# Patient Record
Sex: Female | Born: 2000 | Race: White | Hispanic: No | Marital: Single | Smoking: Never smoker
Health system: Southern US, Community
[De-identification: ages and names within clinical notes are randomized; demographics above are authoritative.]

## PROBLEM LIST (undated history)

## (undated) DIAGNOSIS — R519 Headache, unspecified: Secondary | ICD-10-CM

## (undated) DIAGNOSIS — L309 Dermatitis, unspecified: Secondary | ICD-10-CM

## (undated) DIAGNOSIS — K900 Celiac disease: Secondary | ICD-10-CM

## (undated) HISTORY — DX: Headache, unspecified: R51.9

---
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
## 2022-01-10 ENCOUNTER — Encounter (INDEPENDENT_AMBULATORY_CARE_PROVIDER_SITE_OTHER): Payer: Self-pay | Admitting: Family

## 2022-01-10 ENCOUNTER — Ambulatory Visit (INDEPENDENT_AMBULATORY_CARE_PROVIDER_SITE_OTHER): Payer: BC Managed Care – PPO | Admitting: Family

## 2022-01-10 ENCOUNTER — Other Ambulatory Visit: Payer: Self-pay

## 2022-01-10 VITALS — HR 84 | Wt 120.0 lb

## 2022-01-10 DIAGNOSIS — F32A Depression, unspecified: Secondary | ICD-10-CM

## 2022-01-10 DIAGNOSIS — G43009 Migraine without aura, not intractable, without status migrainosus: Secondary | ICD-10-CM

## 2022-01-10 DIAGNOSIS — F419 Anxiety disorder, unspecified: Secondary | ICD-10-CM | POA: Diagnosis not present

## 2022-01-10 NOTE — Patient Instructions (Signed)
It was a pleasure to see you today! ? ?Instructions for you until your next appointment are as follows: ?Stop taking Trokendi XR.  ?Decrease the Gabapentin dose to 2 capsules in the morning and 2 capsules at night for 1 month. If that goes well, we can continue to reduce the dose as follows:   ?Beginning in month #2 - Take 1 capsule morning and 2 capsules at night for 2 weeks ?Then - take 1 capsule in the morning and 1 capsule at night for 2 weeks ?Then - take none in the morning and 1 capsule at night for 2 weeks ?Then stop the medication ?Continue taking the Emgality each month ?If your headaches become more frequent or more severe, let me know ?Please sign up for MyChart if you have not done so. ?Please plan to return for follow up in 6 months or sooner if needed. ? ?  ?Feel free to contact our office during normal business hours at [phone] with questions or concerns. If there is no answer or the call is outside business hours, please leave a message and our clinic staff will call you back within the next business day.  If you have an urgent concern, please stay on the line for our after-hours answering service and ask for the on-call neurologist.   ?  ?I also encourage you to use MyChart to communicate with me more directly. If you have not yet signed up for MyChart within [facility], the front desk staff can help you. However, please note that this inbox is NOT monitored on nights or weekends, and response can take up to 2 business days.  Urgent matters should be discussed with the on-call pediatric neurologist.  ? ?At [facility], we are committed to providing exceptional care. You will receive a patient satisfaction survey through text or email regarding your visit today. Your opinion is important to me. Comments are appreciated.   ?

## 2022-01-10 NOTE — Progress Notes (Signed)
? ?Latoya Macdonald   ?MRN:  [id number]  ?March 31, 2001  ? ?Provider: Elveria Rising NP-C ?Location of Care: [facility] ? ?Visit type: Return visit ? ?Last visit: 10/12/2021 ? ?Referral source: Reola Calkins, NP ?History from: Epic chart and patient ? ?Brief history:  ?Copied from previous record: ?History of daily persistent headaches, migraine and tension headaches. She is taking and tolerating Trokendi XR, Gabapentin and Emgality for migraine prevention.  ? ?Today's concerns: ?Latoya Macdonald reports today that she has had 2 migraines since her last visit in December. She has tapered the Trokendi XR to 1 capsule per day and has not had increase in migraines. Latoya Macdonald feels that the Emgality injections have been beneficial in reducing her headache frequency and severity.  ? ?Latoya Macdonald reports that the 2 migraines she had were both triggered by stressful events. She is working to learn how to manage stress when it occurs. 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  ? ?Latoya Macdonald has been otherwise generally healthy since she was last seen. She has no other health concerns today other than previously mentioned. ? ?Review of systems: ?Please see HPI for neurologic and other pertinent review of systems. Otherwise all other systems were reviewed and were negative. ? ?Problem List: ?Patient Active Problem List  ? Diagnosis Date Noted  ? Nausea without vomiting 07/08/2021  ? Injection education, encounter for 04/14/2021  ? Paresthesias 11/28/2020  ? Right arm weakness 11/28/2020  ? Migraine without aura, not intractable, without status migrainosus 11/11/2020  ? New daily persistent headache 10/22/2019  ? History of concussion 10/22/2019  ? Anxiety and depression 10/22/2019  ? 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  ? ?Past Medical History:  ?Diagnosis Date  ? Celiac disease   ? Eczema   ? Headache   ?  ?Past medical history comments: See HPI ?Copied from previous record: ?She has been seen in  headache clinic since 2017 and 2018 x 2 neurologists in [place]. ?  ?Medications have included cyproheptadine, nortriptyline, amitriptyline, Depakote, Cymbalta, gabapentin, propranolol, atenolol, Migrelief, Topamax, and Zonegran. ?  ?Abortive medications have included rizatriptan, sumatriptan, eletriptan, baclofen, multiple over-the-counter nonsteroidals, tizanidine chlorzoxazone, diclofenac. ?  ?Nonpharmacologic treatments have included physical therapy, chiropractic manipulation, ultrasound, TENS unit, acupuncture, dry needling. ?  ?Diagnoses have included chronic migraine, chronic tension type headache, occipital neuralgia, TMJ dysfunction. ?  ?MRI without contrast in October, 2013 which was normal. ?  ?On January 10, 2017 an occipital nerve block was performed with bupivacaine and lidocaine.  The patient had immediate numbness.  This did not provide long-term relief.  ?  ?She suffered a concussion while swimming July 16, 2018 which exacerbated her headaches. She had a CT scan at [facility] hospital which was normal.  ?  ?Birth History ?Infant born at [redacted] weeks gestational age to a 19 year old g 1 p 0 female. ?Gestation was complicated by uterine fibroids, stress and grief over the sudden unexpected death of her husband ?Mother received Epidural anesthesia ?Primary cesarean section for failure to progress after 20 hours of labor and 30 minutes of pushing ?Nursery Course was uncomplicated ?Growth and Development was recalled as  normal ?  ?Behavior History ?Anxiety and depression ?  ?Surgical history: ?History reviewed. No pertinent surgical history.  ? ?Family history: ?family history includes Migraines in her maternal grandmother.  ? ?Social history: ?Social History  ? ?Socioeconomic History  ? Marital status: Single  ?  Spouse name: Not on file  ?  Number of children: Not on file  ? Years of education: Not on file  ? Highest education level: Not on file  ?Occupational History  ? Not on file  ?Tobacco Use   ? Smoking status: Never  ? Smokeless tobacco: Never  ?Substance and Sexual Activity  ? Alcohol use: Not on file  ? Drug use: Not on file  ? Sexual activity: Not on file  ?Other Topics Concern  ? Not on file  ?Social History Narrative  ? She is at [employer] for her freshman year.  ? 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   ? ?Social Determinants of Health  ? ?Financial Resource Strain: Not on file  ?Food Insecurity: Not on file  ?Transportation Needs: Not on file  ?Physical Activity: Not on file  ?Stress: Not on file  ?Social Connections: Not on file  ?Intimate Partner Violence: Not on file  ?  ?Past/failed meds: ?Copied from previous record: ?Medications have included cyproheptadine, nortriptyline, amitriptyline, Depakote, Cymbalta, gabapentin, propranolol, atenolol, Migrelief, Topamax, and Zonegran. ?  ?Abortive medications have included rizatriptan, sumatriptan, eletriptan, baclofen, multiple over-the-counter nonsteroidals, tizanidine chlorzoxazone, diclofenac. ?  ?Nonpharmacologic treatments have included physical therapy, chiropractic manipulation, ultrasound, TENS unit, acupuncture, dry needling. ? ?Allergies: ?Allergies  ?Allergen Reactions  ? Gluten Meal   ?  Intolerance not celiac  ?  ?Immunizations: ? ?There is no immunization history on file for this patient.  ? ? ?Diagnostics/Screenings: ?Copied from previous record: ?11/25/2020 - MRI Cervical Spine W/WO contrast - Mildly motion degraded, but otherwise normal MRI of the cervical spine.  ?11/25/2020 - MRI Brain W/WO contrast - Normal brain MRI ? ?Physical Exam: ?Pulse 84   Wt 120 lb (54.4 kg)   BMI 21.32 kg/m?   ?General: Well developed, well nourished young woman, seated on exam table, in no evident distress ?Head: Head normocephalic and atraumatic.  Oropharynx benign. ?Neck: Supple ?Cardiovascular: Regular rate and rhythm, no murmurs ?Respiratory: Breath sounds clear to  auscultation ?Musculoskeletal: No obvious deformities or scoliosis ?Skin: No rashes or neurocutaneous lesions ? ?Neurologic Exam ?Mental Status: Awake and fully alert.  Oriented to place and time.  Recent and remote memory intact.  Attention span, concentration, and fund of knowledge appropriate.  Mood and affect appropriate. ?Cranial Nerves: Fundoscopic exam reveals sharp disc margins.  Pupils equal, briskly reactive to light.  Extraocular movements full without nystagmus. Hearing intact and symmetric to whisper.  Facial sensation intact.  Face tongue, palate move normally and symmetrically. Shoulder shrug normal ?Motor: Normal bulk and tone. Normal strength in all tested extremity muscles. ?Sensory: Intact to touch and temperature in all extremities.  ?Coordination: Rapid alternating movements normal in all extremities.  Finger-to-nose and heel-to shin performed accurately bilaterally.  Romberg negative. ?Gait and Station: Arises from chair without difficulty.  Stance is normal. Gait demonstrates normal stride length and balance.   Able to heel, toe and tandem walk without difficulty. ?Reflexes: 1+ and symmetric. Toes downgoing.  ? ?Impression: ?Migraine without aura, not intractable, without status migrainosus ? ?Anxiety and depression  ? ?Recommendations for plan of care:  ?The patient's previous [facility] records were reviewed. Marge has neither had nor required imaging or lab studies since the last visit. She is an 21 year old young woman with history of migraine and tension headaches, as well as anxiety and depression. She has experienced improvement in her headaches since being on Emgality and has tapered the dose of Trokendi XR without increase in headaches.  Because she is doing well, I recommended that she stop the Trokendi XR and then work on tapering the Gabapentin dose. I gave her written instructions on how to taper the medication, and asked her to let me know if the headaches return during this process. I  will otherwise see Maralyn SagoSarah back in follow up in 6 months or sooner if needed. She agreed with the plans made today ? ?The medication list was reviewed and reconciled. I reviewed the changes that were made in the prescribed medica

## 2022-01-13 ENCOUNTER — Encounter (INDEPENDENT_AMBULATORY_CARE_PROVIDER_SITE_OTHER): Payer: Self-pay | Admitting: Family

## 2022-02-19 ENCOUNTER — Other Ambulatory Visit (INDEPENDENT_AMBULATORY_CARE_PROVIDER_SITE_OTHER): Payer: Self-pay | Admitting: Family

## 2022-02-19 DIAGNOSIS — G43009 Migraine without aura, not intractable, without status migrainosus: Secondary | ICD-10-CM

## 2022-03-05 ENCOUNTER — Telehealth (INDEPENDENT_AMBULATORY_CARE_PROVIDER_SITE_OTHER): Payer: Self-pay | Admitting: Family

## 2022-03-05 NOTE — Telephone Encounter (Signed)
?  Name of who is calling:Latoya Macdonald  ? ?Caller's Relationship to Patient:Self  ? ?Best contact number:[phone] ? ?Provider they ON:[id number] [place]  ? ?Reason for call:Has had a migraine for the past 3 days and has the right sided numbness added now. Caller requested a call back ? ? ? ? ?PRESCRIPTION REFILL ONLY ? ?Name of prescription: ? ?Pharmacy: ? ? ?

## 2022-03-06 NOTE — Telephone Encounter (Signed)
Attempted to call patient to get more information on her migraines and numbness for the provider. No answer. ?Patient has had a migraine for 3 days. Wants any recommendations that can help ?

## 2022-03-06 NOTE — Telephone Encounter (Signed)
I called and left a message, inviting the patient to call back. TG ?

## 2022-03-12 NOTE — Telephone Encounter (Signed)
Latoya Macdonald has not called back. TG ?

## 2022-03-15 DIAGNOSIS — R8281 Pyuria: Secondary | ICD-10-CM | POA: Diagnosis not present

## 2022-03-15 DIAGNOSIS — J4530 Mild persistent asthma, uncomplicated: Secondary | ICD-10-CM | POA: Diagnosis not present

## 2022-03-15 DIAGNOSIS — Z7251 High risk heterosexual behavior: Secondary | ICD-10-CM | POA: Diagnosis not present

## 2022-03-15 DIAGNOSIS — F419 Anxiety disorder, unspecified: Secondary | ICD-10-CM | POA: Diagnosis not present

## 2022-03-15 DIAGNOSIS — Z113 Encounter for screening for infections with a predominantly sexual mode of transmission: Secondary | ICD-10-CM | POA: Diagnosis not present

## 2022-03-15 DIAGNOSIS — Z01419 Encounter for gynecological examination (general) (routine) without abnormal findings: Secondary | ICD-10-CM | POA: Diagnosis not present

## 2022-03-15 DIAGNOSIS — Z68.41 Body mass index (BMI) pediatric, 5th percentile to less than 85th percentile for age: Secondary | ICD-10-CM | POA: Diagnosis not present

## 2022-03-15 DIAGNOSIS — Z0000 Encounter for general adult medical examination without abnormal findings: Secondary | ICD-10-CM | POA: Diagnosis not present

## 2022-03-15 DIAGNOSIS — Z23 Encounter for immunization: Secondary | ICD-10-CM | POA: Diagnosis not present

## 2022-05-07 DIAGNOSIS — J4530 Mild persistent asthma, uncomplicated: Secondary | ICD-10-CM | POA: Diagnosis not present

## 2022-05-07 DIAGNOSIS — J3089 Other allergic rhinitis: Secondary | ICD-10-CM | POA: Diagnosis not present

## 2022-05-07 DIAGNOSIS — J301 Allergic rhinitis due to pollen: Secondary | ICD-10-CM | POA: Diagnosis not present

## 2022-05-07 DIAGNOSIS — J3081 Allergic rhinitis due to animal (cat) (dog) hair and dander: Secondary | ICD-10-CM | POA: Diagnosis not present

## 2022-05-31 DIAGNOSIS — F411 Generalized anxiety disorder: Secondary | ICD-10-CM | POA: Diagnosis not present

## 2022-05-31 DIAGNOSIS — F909 Attention-deficit hyperactivity disorder, unspecified type: Secondary | ICD-10-CM | POA: Diagnosis not present

## 2022-05-31 DIAGNOSIS — F330 Major depressive disorder, recurrent, mild: Secondary | ICD-10-CM | POA: Diagnosis not present

## 2022-06-04 DIAGNOSIS — L718 Other rosacea: Secondary | ICD-10-CM | POA: Diagnosis not present

## 2022-06-04 DIAGNOSIS — L700 Acne vulgaris: Secondary | ICD-10-CM | POA: Diagnosis not present

## 2022-07-04 ENCOUNTER — Telehealth (INDEPENDENT_AMBULATORY_CARE_PROVIDER_SITE_OTHER): Payer: Self-pay | Admitting: Family

## 2022-07-04 DIAGNOSIS — G43009 Migraine without aura, not intractable, without status migrainosus: Secondary | ICD-10-CM

## 2022-07-04 MED ORDER — EMGALITY 120 MG/ML ~~LOC~~ SOAJ: 120.0000 mg | SUBCUTANEOUS | 0 refills | Status: DC

## 2022-07-04 NOTE — Telephone Encounter (Signed)
Who's calling (name and relationship to patient) : Concepcion Living; self   Best contact number: [phone]  Provider they see: Blane Ohara, NP  Reason for call: Deaira has called in stating that she is having a hard time getting prescription filled  for [facility] shot. She has requested a call back once its been taking care of.   Call ID:      PRESCRIPTION REFILL ONLY  Name of prescription:  Pharmacy: [facility] on [address], [place]

## 2022-07-04 NOTE — Telephone Encounter (Signed)
Contacted pt. Verified Name and DOB.  Informed pt that the RX refill has been sent in and that the medication required a PA. Also informed pt that the PA typically takes 72 hours to receive a response.  Pt verbalized understanding of this.   SS, CCMA

## 2022-07-04 NOTE — Telephone Encounter (Addendum)
Please let Latoya Macdonald know that I sent the refill in to [facility] as as requested. Also, the medication requires a prior authorization with her insurance, which I have initiated. It typically takes 72 hours to get a response from that. Thanks, Inetta Fermo

## 2022-07-10 DIAGNOSIS — Z20822 Contact with and (suspected) exposure to covid-19: Secondary | ICD-10-CM | POA: Diagnosis not present

## 2022-07-10 DIAGNOSIS — J029 Acute pharyngitis, unspecified: Secondary | ICD-10-CM | POA: Diagnosis not present

## 2022-07-11 DIAGNOSIS — H6503 Acute serous otitis media, bilateral: Secondary | ICD-10-CM | POA: Diagnosis not present

## 2022-07-11 DIAGNOSIS — J209 Acute bronchitis, unspecified: Secondary | ICD-10-CM | POA: Diagnosis not present

## 2022-07-11 DIAGNOSIS — J019 Acute sinusitis, unspecified: Secondary | ICD-10-CM | POA: Diagnosis not present

## 2022-07-17 ENCOUNTER — Ambulatory Visit (INDEPENDENT_AMBULATORY_CARE_PROVIDER_SITE_OTHER): Payer: BC Managed Care – PPO | Admitting: Family

## 2022-07-20 DIAGNOSIS — N760 Acute vaginitis: Secondary | ICD-10-CM | POA: Diagnosis not present

## 2022-08-06 ENCOUNTER — Other Ambulatory Visit (INDEPENDENT_AMBULATORY_CARE_PROVIDER_SITE_OTHER): Payer: Self-pay | Admitting: Family

## 2022-08-06 DIAGNOSIS — G43009 Migraine without aura, not intractable, without status migrainosus: Secondary | ICD-10-CM

## 2022-08-06 NOTE — Telephone Encounter (Signed)
Last seen 12/2021- no showed appt 07/17/22  Next appts is scheduled 10/18/22. Due to not returning when provider requested RN will send request to provider

## 2022-09-05 ENCOUNTER — Other Ambulatory Visit (INDEPENDENT_AMBULATORY_CARE_PROVIDER_SITE_OTHER): Payer: Self-pay | Admitting: Family

## 2022-09-05 DIAGNOSIS — G43009 Migraine without aura, not intractable, without status migrainosus: Secondary | ICD-10-CM

## 2022-09-06 ENCOUNTER — Telehealth (INDEPENDENT_AMBULATORY_CARE_PROVIDER_SITE_OTHER): Payer: Self-pay | Admitting: Family

## 2022-09-06 DIAGNOSIS — G43009 Migraine without aura, not intractable, without status migrainosus: Secondary | ICD-10-CM

## 2022-09-06 MED ORDER — EMGALITY 120 MG/ML ~~LOC~~ SOSY: 120.0000 mg | PREFILLED_SYRINGE | SUBCUTANEOUS | 1 refills | Status: DC

## 2022-09-06 NOTE — Telephone Encounter (Signed)
I called and spoke with Latoya Macdonald. He said that the [facility] autoinjector was back ordered but that he could get the prefilled syringe. He said that he pharmacy would show her how to administer it. I agreed with this plan and sent in a new Rx. TG

## 2022-09-06 NOTE — Telephone Encounter (Signed)
  Name of who is calling: Sam Scientist, research (physical sciences)) from [employer]  Best contact number: [phone]  Provider they see: Elveria Rising  Reason for call: Same is calling stating he spoke to some one previously. They do not and will not have the emgality prefilled pen available until the 17th. But they can order a prefilled syringe which is equivalent. He said he cannot switch it though unless you agree and the patient does. Also he cannot switch it unless he has a new RX for the prefilled syringe. The prefilled syringe is the same dose as the prefilled pen.

## 2022-09-07 DIAGNOSIS — G43909 Migraine, unspecified, not intractable, without status migrainosus: Secondary | ICD-10-CM | POA: Diagnosis not present

## 2022-10-05 ENCOUNTER — Other Ambulatory Visit (INDEPENDENT_AMBULATORY_CARE_PROVIDER_SITE_OTHER): Payer: Self-pay | Admitting: Family

## 2022-10-05 DIAGNOSIS — G43009 Migraine without aura, not intractable, without status migrainosus: Secondary | ICD-10-CM

## 2022-10-11 DIAGNOSIS — T385X5A Adverse effect of other estrogens and progestogens, initial encounter: Secondary | ICD-10-CM | POA: Diagnosis not present

## 2022-10-11 DIAGNOSIS — N938 Other specified abnormal uterine and vaginal bleeding: Secondary | ICD-10-CM | POA: Diagnosis not present

## 2022-10-11 DIAGNOSIS — Z3046 Encounter for surveillance of implantable subdermal contraceptive: Secondary | ICD-10-CM | POA: Diagnosis not present

## 2022-10-11 DIAGNOSIS — Z3202 Encounter for pregnancy test, result negative: Secondary | ICD-10-CM | POA: Diagnosis not present

## 2022-10-18 ENCOUNTER — Encounter (INDEPENDENT_AMBULATORY_CARE_PROVIDER_SITE_OTHER): Payer: Self-pay | Admitting: Family

## 2022-10-18 ENCOUNTER — Ambulatory Visit (INDEPENDENT_AMBULATORY_CARE_PROVIDER_SITE_OTHER): Payer: BC Managed Care – PPO | Admitting: Family

## 2022-10-18 VITALS — BP 110/74 | HR 88 | Ht 63.78 in | Wt 122.0 lb

## 2022-10-18 DIAGNOSIS — F419 Anxiety disorder, unspecified: Secondary | ICD-10-CM

## 2022-10-18 DIAGNOSIS — F32A Depression, unspecified: Secondary | ICD-10-CM

## 2022-10-18 DIAGNOSIS — L249 Irritant contact dermatitis, unspecified cause: Secondary | ICD-10-CM | POA: Diagnosis not present

## 2022-10-18 DIAGNOSIS — G43009 Migraine without aura, not intractable, without status migrainosus: Secondary | ICD-10-CM

## 2022-10-18 MED ORDER — EMGALITY 120 MG/ML ~~LOC~~ SOAJ: SUBCUTANEOUS | 5 refills | Status: DC

## 2022-10-18 NOTE — Patient Instructions (Signed)
It was a pleasure to see you today!  Instructions for you until your next appointment are as follows: Continue the Emgality injections as prescribed.  Let me know if the headaches become more frequent or more severe.  Remember that it is important for you to avoid skipping meals, to drink plenty of water each day and to get at least 9 hours of sleep each night as these things are known to reduce how often headaches occur.   Please sign up for MyChart if you have not done so. Please plan to return for follow up in 6 months or sooner if needed.  Feel free to contact our office during normal business hours at [phone] with questions or concerns. If there is no answer or the call is outside business hours, please leave a message and our clinic staff will call you back within the next business day.  If you have an urgent concern, please stay on the line for our after-hours answering service and ask for the on-call neurologist.     I also encourage you to use MyChart to communicate with me more directly. If you have not yet signed up for MyChart within [facility], the front desk staff can help you. However, please note that this inbox is NOT monitored on nights or weekends, and response can take up to 2 business days.  Urgent matters should be discussed with the on-call pediatric neurologist.   At [facility], we are committed to providing exceptional care. You will receive a patient satisfaction survey through text or email regarding your visit today. Your opinion is important to me. Comments are appreciated.

## 2022-10-18 NOTE — Progress Notes (Signed)
Evaluna Utke   MRN:  [id number]  February 14, 2001   Provider: Elveria Rising NP-C Location of Care: [facility]  Visit type: Return visit  Last visit: 01/10/2022  Referral source: Reola Calkins, NP History from: Epic chart and patient  Brief history:  Copied from previous record: History of daily persistent headaches, migraine and tension headaches. She is taking and tolerating Trokendi XR, Gabapentin and Emgality for migraine prevention.    Today's concerns: Headaches have improved since being on Emgality Still tends to have breakthrough migraines with stressed, such as during exam time.  Does not skip meals, drinks plenty of water and usually sleeps well at night. Syliva has been otherwise generally healthy since she was last seen. No health concerns today other than previously mentioned.  Review of systems: Please see HPI for neurologic and other pertinent review of systems. Otherwise all other systems were reviewed and were negative.  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   Medications have included cyproheptadine, nortriptyline, amitriptyline, Depakote, Cymbalta, gabapentin, propranolol, atenolol, Migrelief, Topamax, and Zonegran.   Abortive medications have included rizatriptan, sumatriptan, eletriptan,  baclofen, multiple over-the-counter nonsteroidals, tizanidine chlorzoxazone, diclofenac.   Nonpharmacologic treatments have included physical therapy, chiropractic manipulation, ultrasound, TENS unit, acupuncture, dry needling.   Diagnoses have included chronic migraine, chronic tension type headache, occipital neuralgia, TMJ dysfunction.   MRI without contrast in October, 2013 which was normal.   On January 10, 2017 an occipital nerve block was performed with bupivacaine and lidocaine.  The patient had immediate numbness.  This did not provide long-term relief.    She suffered a concussion while swimming July 16, 2018 which exacerbated her headaches. She had a CT scan at [facility] which was normal.    Birth History Infant born at [redacted] weeks gestational age to a 19 year old g 1 p 0 female. Gestation was complicated by uterine fibroids, stress and grief over the sudden unexpected death of her husband Mother received Epidural anesthesia Primary cesarean section for failure to progress after 20 hours of labor and 30 minutes of pushing Nursery Course was uncomplicated Growth and Development was recalled as  normal   Behavior History Anxiety and depression  Surgical history: History reviewed. No pertinent surgical history.   Family history: family history includes Migraines in her maternal grandmother.   Social history: Social History   Socioeconomic History   Marital status: Single    Spouse name: Not on file   Number of children: Not on file   Years of education: Not on file   Highest education level: Not on file  Occupational History   Not on file  Tobacco Use   Smoking status: Never   Smokeless tobacco: Never  Substance and Sexual Activity   Alcohol use: Not on file   Drug use:  Not on file   Sexual activity: Not on file  Other Topics Concern   Not on file  Social History Narrative   She is at [employer] on [employer] for her freshman year.   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    Social Determinants of Health   Financial Resource Strain: Not on file  Food Insecurity: Not on file  Transportation Needs: Not on file  Physical Activity: Not on file  Stress: Not on file  Social Connections: Not on file  Intimate Partner Violence: Not on file    Past/failed meds: Copied from previous record: Medications have included cyproheptadine, nortriptyline, amitriptyline, Depakote, Cymbalta, gabapentin, propranolol, atenolol, Migrelief, Topamax, and Zonegran.   Abortive medications have included rizatriptan, sumatriptan, eletriptan, baclofen, multiple over-the-counter nonsteroidals, tizanidine chlorzoxazone, diclofenac.   Nonpharmacologic treatments have included physical therapy, chiropractic manipulation, ultrasound, TENS unit, acupuncture, dry needling.   Allergies: Allergies  Allergen Reactions   Gluten Meal     Intolerance not celiac    Immunizations:  There is no immunization history on file for this patient.   Diagnostics/Screenings: Copied from previous record: 11/25/2020 - MRI Cervical Spine W/WO contrast - Mildly motion degraded, but otherwise normal MRI of the cervical spine.  11/25/2020 - MRI Brain W/WO contrast - Normal brain MRI  Physical Exam: BP 110/74 (BP Location: Right Arm, Patient Position: Sitting, Cuff Size: Small)   Pulse 88   Ht 5' 3.78" (1.62 m)   Wt 122 lb (55.3 kg)   BMI 21.09 kg/m   General: Well developed, well nourished young woman, seated in exam table, in no evident distress Head: Head normocephalic and atraumatic.  Oropharynx benign. Neck: Supple Cardiovascular: Regular rate and rhythm, no murmurs Respiratory: Breath sounds clear to auscultation Musculoskeletal: No obvious deformities or scoliosis Skin: No rashes or neurocutaneous lesions  Neurologic Exam Mental Status: Awake and fully alert.  Oriented to place and time.  Recent and remote  memory intact.  Attention span, concentration, and fund of knowledge appropriate.  Mood and affect appropriate. Cranial Nerves: Fundoscopic exam reveals sharp disc margins.  Pupils equal, briskly reactive to light.  Extraocular movements full without nystagmus. Hearing intact and symmetric to whisper.  Facial sensation intact.  Face tongue, palate move normally and symmetrically. Shoulder shrug normal Motor: Normal bulk and tone. Normal strength in all tested extremity muscles. Sensory: Intact to touch and temperature in all extremities.  Coordination: Rapid alternating movements normal in all extremities.  Finger-to-nose and heel-to shin performed accurately bilaterally.  Romberg negative. Gait and Station: Arises from chair without difficulty.  Stance is normal. Gait demonstrates normal stride length and balance.   Able to heel, toe and tandem walk without difficulty. Reflexes: 1+ and symmetric. Toes downgoing.   Impression: Migraine without aura, not intractable, without status migrainosus - Plan: EMGALITY 120 MG/ML SOAJ  Anxiety and depression   Recommendations for plan of care: The patient's previous Epic records were reviewed. Doing well in college and migraines not problematic since being on Emgality. Plan until next visit: Continue monthly Emglaity injections as prescribed  Reminded to avoid skipping meals, to drink plenty of water and to get at least 8 hours of sleep each night Work on stress management Call if headaches become more frequent or more severe Return in about 6 months (around 04/19/2023).  The medication list was reviewed and reconciled. No changes were made in the prescribed medications today. A complete medication list was provided to the patient.  Allergies as of 10/18/2022       Reactions   Gluten Meal    Intolerance not celiac        Medication List        Accurate as of October 18, 2022  7:29 PM. If you have any questions, ask your nurse or doctor.           STOP taking these medications    Adapalene 0.3 % gel Stopped by: Elveria Rising, NP   clindamycin 1 % lotion Commonly known as: CLEOCIN T Stopped by: Elveria Rising, NP   fluticasone 50 MCG/ACT nasal spray Commonly known as: FLONASE Stopped by: Elveria Rising, NP   ondansetron 4 MG disintegrating tablet Commonly known as: ZOFRAN-ODT Stopped by: Elveria Rising, NP   triamcinolone cream 0.1 % Commonly known as: KENALOG Stopped by: Elveria Rising, NP       TAKE these medications    Advair HFA 45-21 MCG/ACT inhaler Generic drug: fluticasone-salmeterol Inhale into the lungs.   albuterol 108 (90 Base) MCG/ACT inhaler Commonly known as: VENTOLIN HFA Inhale into the lungs.   amphetamine-dextroamphetamine 10 MG tablet Commonly known as: ADDERALL Take 10 mg by mouth 2 (two) times daily.   Azelastine HCl 137 MCG/SPRAY Soln 1 spray ea nostril   busPIRone 5 MG tablet Commonly known as: BUSPAR Take 5 mg by mouth 2 (two) times daily.   cetirizine 10 MG tablet Commonly known as: ZYRTEC Take by mouth.   desvenlafaxine 100 MG 24 hr tablet Commonly known as: PRISTIQ Take 100 mg by mouth daily.   dextroamphetamine 10 MG 24 hr capsule Commonly known as: DEXEDRINE SPANSULE Take 10 mg by mouth 2 (two) times daily.   Emgality 120 MG/ML Soaj Generic drug: Galcanezumab-gnlm INJECT 120 MG INTO THE SKIN EVERY 30 (THIRTY) DAYS. *PA SENT 07/04/22   gabapentin 300 MG capsule Commonly known as: NEURONTIN TAKE 2 CAPSULES BY MOUTH IN THE MORNING AND 3 IN THE EVENING   loratadine 10 MG tablet Commonly known as: CLARITIN Take 10 mg by mouth daily.   montelukast 10 MG tablet Commonly known as: SINGULAIR   Nexplanon 68 MG Impl implant Generic drug: etonogestrel 68 mg by Subdermal route. Insertion date:02/03/2020   traZODone 50 MG tablet Commonly known as: DESYREL      Total time spent with the patient was 20 minutes, of which 50% or more was spent in  counseling and coordination of care.  Elveria Rising NP-C [facility] [address], [address] [place], [place] [address] Ph. [phone] Fax [phone]

## 2022-10-25 DIAGNOSIS — F909 Attention-deficit hyperactivity disorder, unspecified type: Secondary | ICD-10-CM | POA: Diagnosis not present

## 2022-10-25 DIAGNOSIS — F330 Major depressive disorder, recurrent, mild: Secondary | ICD-10-CM | POA: Diagnosis not present

## 2022-10-25 DIAGNOSIS — Z79899 Other long term (current) drug therapy: Secondary | ICD-10-CM | POA: Diagnosis not present

## 2022-10-25 DIAGNOSIS — F411 Generalized anxiety disorder: Secondary | ICD-10-CM | POA: Diagnosis not present

## 2022-12-23 DIAGNOSIS — R112 Nausea with vomiting, unspecified: Secondary | ICD-10-CM | POA: Diagnosis not present

## 2022-12-23 DIAGNOSIS — D72829 Elevated white blood cell count, unspecified: Secondary | ICD-10-CM | POA: Diagnosis not present

## 2022-12-23 DIAGNOSIS — R1111 Vomiting without nausea: Secondary | ICD-10-CM | POA: Diagnosis not present

## 2022-12-23 DIAGNOSIS — G43909 Migraine, unspecified, not intractable, without status migrainosus: Secondary | ICD-10-CM | POA: Diagnosis not present

## 2022-12-23 DIAGNOSIS — R519 Headache, unspecified: Secondary | ICD-10-CM | POA: Diagnosis not present

## 2022-12-27 DIAGNOSIS — N760 Acute vaginitis: Secondary | ICD-10-CM | POA: Diagnosis not present

## 2022-12-27 DIAGNOSIS — R1031 Right lower quadrant pain: Secondary | ICD-10-CM | POA: Diagnosis not present

## 2022-12-27 DIAGNOSIS — N390 Urinary tract infection, site not specified: Secondary | ICD-10-CM | POA: Diagnosis not present

## 2022-12-27 DIAGNOSIS — R300 Dysuria: Secondary | ICD-10-CM | POA: Diagnosis not present

## 2022-12-27 DIAGNOSIS — R55 Syncope and collapse: Secondary | ICD-10-CM | POA: Diagnosis not present

## 2022-12-27 DIAGNOSIS — N83202 Unspecified ovarian cyst, left side: Secondary | ICD-10-CM | POA: Diagnosis not present

## 2022-12-28 DIAGNOSIS — R1031 Right lower quadrant pain: Secondary | ICD-10-CM | POA: Diagnosis not present

## 2022-12-28 DIAGNOSIS — N390 Urinary tract infection, site not specified: Secondary | ICD-10-CM | POA: Diagnosis not present

## 2022-12-28 DIAGNOSIS — N83202 Unspecified ovarian cyst, left side: Secondary | ICD-10-CM | POA: Diagnosis not present

## 2023-01-07 DIAGNOSIS — R399 Unspecified symptoms and signs involving the genitourinary system: Secondary | ICD-10-CM | POA: Diagnosis not present

## 2023-01-07 DIAGNOSIS — N83292 Other ovarian cyst, left side: Secondary | ICD-10-CM | POA: Diagnosis not present

## 2023-01-07 DIAGNOSIS — Z113 Encounter for screening for infections with a predominantly sexual mode of transmission: Secondary | ICD-10-CM | POA: Diagnosis not present

## 2023-01-07 DIAGNOSIS — N83202 Unspecified ovarian cyst, left side: Secondary | ICD-10-CM | POA: Diagnosis not present

## 2023-01-07 DIAGNOSIS — R1031 Right lower quadrant pain: Secondary | ICD-10-CM | POA: Diagnosis not present

## 2023-01-08 ENCOUNTER — Ambulatory Visit (INDEPENDENT_AMBULATORY_CARE_PROVIDER_SITE_OTHER): Payer: BC Managed Care – PPO | Admitting: Family

## 2023-01-08 ENCOUNTER — Encounter (INDEPENDENT_AMBULATORY_CARE_PROVIDER_SITE_OTHER): Payer: Self-pay | Admitting: Family

## 2023-01-08 VITALS — BP 132/78 | HR 120 | Ht 60.39 in | Wt 123.0 lb

## 2023-01-08 DIAGNOSIS — G43009 Migraine without aura, not intractable, without status migrainosus: Secondary | ICD-10-CM

## 2023-01-08 DIAGNOSIS — F32A Depression, unspecified: Secondary | ICD-10-CM | POA: Diagnosis not present

## 2023-01-08 DIAGNOSIS — F419 Anxiety disorder, unspecified: Secondary | ICD-10-CM | POA: Diagnosis not present

## 2023-01-08 MED ORDER — GABAPENTIN 300 MG PO CAPS: ORAL_CAPSULE | ORAL | 3 refills | Status: DC

## 2023-01-08 MED ORDER — UBRELVY 50 MG PO TABS: ORAL_TABLET | ORAL | 1 refills | Status: DC

## 2023-01-08 NOTE — Progress Notes (Signed)
Latoya Macdonald   MRN:  [id number]  [birth year]   Provider: Rockwell Germany NP-C Location of Care: [facility]  Visit type: Return visit  Last visit: 10/18/2022  Referral source: Diamantina Monks, NP History from: Epic chart and patient  Brief history:  Copied from previous record: History of daily persistent headaches, migraine and tension headaches. She is taking and tolerating Emgality for migraine prevention. She tapered off Trokendi XR and Gabapentin for migraine prevention when the Emgality gave her improvement in migraine frequency.   Today's concerns: Did well last fall after tapering off Gabapentin but has started having more migraines for about the last month.  Is now experiencing 1-2 per month that can last for 3-4 days on average Had prolonged migraine at the end of February that lasted 7 days. Was seen in ED and received "migraine cocktail". Says that the medication gave her relief for about 24 hours but then she developed another migraine headache.  Denies missed meals, says that she drinks water and typically gets enough sleep. Denies unusual stressors and says that school is going well. Latoya Macdonald has been otherwise generally healthy since she was last seen. No health concerns today other than previously mentioned.  Review of systems: Please see HPI for neurologic and other pertinent review of systems. Otherwise all other systems were reviewed and were negative.  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   Medications have included cyproheptadine, nortriptyline, amitriptyline, Depakote, Cymbalta, gabapentin, propranolol, atenolol, Migrelief, Topamax, and Zonegran.   Abortive medications have included rizatriptan, sumatriptan, eletriptan, baclofen, multiple over-the-counter nonsteroidals, tizanidine chlorzoxazone, diclofenac.   Nonpharmacologic treatments have included physical therapy, chiropractic manipulation, ultrasound, TENS unit, acupuncture, dry needling.   Diagnoses have included chronic migraine, chronic tension type headache, occipital neuralgia, TMJ dysfunction.   MRI without contrast in October, 2013 which was normal.   On January 10, 2017 an occipital nerve block was performed with bupivacaine and lidocaine.  The patient had immediate numbness.  This did not provide long-term relief.    She suffered a concussion while swimming July 16, 2018 which exacerbated her headaches. She had a CT scan at [facility] which was normal.    Birth History Infant born at [redacted] weeks gestational age to a 20 year old g 1 p 0 female. Gestation was complicated by uterine fibroids, stress and grief over the sudden unexpected death of her husband Mother received Epidural anesthesia Primary cesarean section for failure to progress after 20 hours of labor and 30 minutes of pushing Nursery Course was uncomplicated Growth and Development was recalled as  normal   Behavior History Anxiety and depression  Surgical history: No past surgical history on file.   Family history: family history includes Migraines in her maternal grandmother.   Social  history: Social History   Socioeconomic History   Marital status: Single    Spouse name: Not on file   Number of  children: Not on file   Years of education: Not on file   Highest education level: Not on file  Occupational History   Not on file  Tobacco Use   Smoking status: Never   Smokeless tobacco: Never  Substance and Sexual Activity   Alcohol use: Not on file   Drug use: Not on file   Sexual activity: Not on file  Other Topics Concern   Not on file  Social History Narrative   She is at [employer] living on campus for her freshman year.   She is studying early education with the intentions of being a Oncologist.    Social Determinants of Health   Financial Resource Strain: Not on file  Food Insecurity: Not on file  Transportation Needs: Not on file  Physical Activity: Not on file  Stress: Not on file  Social Connections: Not on file  Intimate Partner Violence: Not on file    Past/failed meds: Copied from previous record: Medications have included cyproheptadine, nortriptyline, amitriptyline, Depakote, Cymbalta, gabapentin, propranolol, atenolol, Migrelief, Topamax, and Zonegran.   Abortive medications have included rizatriptan, sumatriptan, eletriptan, baclofen, multiple over-the-counter nonsteroidals, tizanidine chlorzoxazone, diclofenac.   Nonpharmacologic treatments have included physical therapy, chiropractic manipulation, ultrasound, TENS unit, acupuncture, dry needling.  Allergies: Allergies  Allergen Reactions   Gluten Meal     Intolerance not celiac     Immunizations:  There is no immunization history on file for this patient.   Diagnostics/Screenings: Copied from previous record: 11/25/2020 - MRI Cervical Spine W/WO contrast - Mildly motion degraded, but otherwise normal MRI of the cervical spine.  11/25/2020 - MRI Brain W/WO contrast - Normal brain MRI  Physical Exam: BP 132/78 (BP Location: Right Arm, Patient Position: Sitting, Cuff Size: Normal)   Pulse (!) 120 Comment: checked twice  Ht 5' 0.39" (1.534 m)   Wt 123 lb (55.8 kg)   LMP 01/08/2023 (Exact  Date)   BMI 23.71 kg/m   General: Well developed, well nourished, seated, in no evident distress Head: Head normocephalic and atraumatic.  Oropharynx benign. Neck: Supple Cardiovascular: Regular rate and rhythm, no murmurs Respiratory: Breath sounds clear to auscultation Musculoskeletal: No obvious deformities or scoliosis Skin: No rashes or neurocutaneous lesions  Neurologic Exam Mental Status: Awake and fully alert.  Oriented to place and time.  Recent and remote memory intact.  Attention span, concentration, and fund of knowledge appropriate.  Mood and affect appropriate. Cranial Nerves: Fundoscopic exam reveals sharp disc margins.  Pupils equal, briskly reactive to light.  Extraocular movements full without nystagmus. Hearing intact and symmetric to whisper.  Facial sensation intact.  Face tongue, palate move normally and symmetrically. Shoulder shrug normal Motor: Normal bulk and tone. Normal strength in all tested extremity muscles. Sensory: Intact to touch and temperature in all extremities.  Coordination: Rapid alternating movements normal in all extremities.  Finger-to-nose and heel-to shin performed accurately bilaterally.  Romberg negative. Gait and Station: Arises from chair without difficulty.  Stance is normal. Gait demonstrates normal stride length and balance.   Able to heel, toe and tandem walk without difficulty. Reflexes: 1+ and symmetric. Toes downgoing.   Impression: Migraine without aura, not intractable, without status migrainosus - Plan: gabapentin (NEURONTIN) 300 MG capsule, UBRELVY 50 MG TABS  Anxiety and depression   Recommendations for plan of care: The patient's previous Epic records were reviewed. No  recent diagnostic studies to be reviewed with the patient.  Plan until next visit: Continue Emgality as prescribed  Restart Gabapentin Will try Ubrelvy for migraine relief Call or send MyChart message in 1 week to report on condition.  Reminded to continue to  avoid skipping meals, to drink plenty of water and to get at least 8 hours of sleep each night.  Follow up in June as scheduled  The medication list was reviewed and reconciled. I reviewed the changes that were made in the prescribed medications today. A complete medication list was provided to the patient.   Allergies as of 01/08/2023       Reactions   Gluten Meal    Intolerance not celiac        Medication List        Accurate as of January 08, 2023 11:59 PM. If you have any questions, ask your nurse or doctor.          Advair HFA 45-21 MCG/ACT inhaler Generic drug: fluticasone-salmeterol Inhale into the lungs.   albuterol 108 (90 Base) MCG/ACT inhaler Commonly known as: VENTOLIN HFA Inhale into the lungs.   amphetamine-dextroamphetamine 10 MG tablet Commonly known as: ADDERALL Take 10 mg by mouth 2 (two) times daily.   Azelastine HCl 137 MCG/SPRAY Soln 1 spray ea nostril   busPIRone 5 MG tablet Commonly known as: BUSPAR Take 5 mg by mouth 2 (two) times daily.   cetirizine 10 MG tablet Commonly known as: ZYRTEC Take by mouth.   desvenlafaxine 100 MG 24 hr tablet Commonly known as: PRISTIQ Take 100 mg by mouth daily.   dextroamphetamine 10 MG 24 hr capsule Commonly known as: DEXEDRINE SPANSULE Take 10 mg by mouth 2 (two) times daily.   Emgality 120 MG/ML Soaj Generic drug: Galcanezumab-gnlm INJECT 120 MG INTO THE SKIN EVERY 30 (THIRTY) DAYS. *PA SENT 07/04/22   gabapentin 300 MG capsule Commonly known as: NEURONTIN Take 1 capsule at bedtime for 4 days then take 1 capsule twice per day What changed: See the new instructions. Changed by: Rockwell Germany, NP   loratadine 10 MG tablet Commonly known as: CLARITIN Take 10 mg by mouth daily.   montelukast 10 MG tablet Commonly known as: SINGULAIR   Nexplanon 68 MG Impl implant Generic drug: etonogestrel 68 mg by Subdermal route. Insertion date:02/03/2020   traZODone 50 MG tablet Commonly known as:  DESYREL   Ubrelvy 50 MG Tabs Generic drug: Ubrogepant Take 1 tablet at onset of migraine. May take 1 additional tablet in 2 hours if migraine persists. Do not take more than 2 tablets in 24 hours. Started by: Rockwell Germany, NP      Total time spent with the patient was 25 minutes, of which 50% or more was spent in counseling and coordination of care.  Rockwell Germany NP-C [facility] [id number] [place], [place] [address] Ph. [phone] Fax [phone]

## 2023-01-08 NOTE — Patient Instructions (Signed)
It was a pleasure to see you today!  Instructions for you until your next appointment are as follows: We will restart Gabapentin '300mg'$ . Take 1 capsule at bedtime for 4 days, then take 1 capsule in the morning and at night I also sent in a prescription for Ubrelvy '50mg'$ . This is a medication to take when a migraine occurs. Take 1 tablet at the onset of a migraine. If the headache is still there in 2 hours, take 1 more tablet. Do not take more than 2 tablets in 24 hours.  Send me a MyChart message in 1 week to let me know how you are doing. We can make adjustment in the Gabapentin dose if needed, but I want to go slow so that you will not have side effects.  Remember that it is important for you to avoid skipping meals, to drink plenty of water each day and to get at least 9 hours of sleep each night as these things are known to reduce how often headaches occur.   Please plan to return for follow up in June as scheduled or sooner if needed.  Feel free to contact our office during normal business hours at [phone] with questions or concerns. If there is no answer or the call is outside business hours, please leave a message and our clinic staff will call you back within the next business day.  If you have an urgent concern, please stay on the line for our after-hours answering service and ask for the on-call neurologist.     I also encourage you to use MyChart to communicate with me more directly. If you have not yet signed up for MyChart within [facility], the front desk staff can help you. However, please note that this inbox is NOT monitored on nights or weekends, and response can take up to 2 business days.  Urgent matters should be discussed with the on-call pediatric neurologist.   At [facility], we are committed to providing exceptional care. You will receive a patient satisfaction survey through text or email regarding your visit today. Your opinion is important to me. Comments are  appreciated.

## 2023-01-11 ENCOUNTER — Encounter (INDEPENDENT_AMBULATORY_CARE_PROVIDER_SITE_OTHER): Payer: Self-pay | Admitting: Family

## 2023-01-14 ENCOUNTER — Encounter (INDEPENDENT_AMBULATORY_CARE_PROVIDER_SITE_OTHER): Payer: Self-pay

## 2023-01-15 ENCOUNTER — Encounter (INDEPENDENT_AMBULATORY_CARE_PROVIDER_SITE_OTHER): Payer: Self-pay

## 2023-01-15 NOTE — Telephone Encounter (Signed)
[id number]- cover my meds shows patient is not a member- Tried requesting from scratch and was able to send to [employer] plan to follow up on [id number] Completed a PA- for [place] sent to [employer]- said 1-5 days to make a decision

## 2023-02-18 DIAGNOSIS — F411 Generalized anxiety disorder: Secondary | ICD-10-CM | POA: Diagnosis not present

## 2023-02-22 DIAGNOSIS — F902 Attention-deficit hyperactivity disorder, combined type: Secondary | ICD-10-CM | POA: Diagnosis not present

## 2023-02-25 DIAGNOSIS — F411 Generalized anxiety disorder: Secondary | ICD-10-CM | POA: Diagnosis not present

## 2023-03-04 DIAGNOSIS — F411 Generalized anxiety disorder: Secondary | ICD-10-CM | POA: Diagnosis not present

## 2023-03-12 DIAGNOSIS — F411 Generalized anxiety disorder: Secondary | ICD-10-CM | POA: Diagnosis not present

## 2023-03-15 DIAGNOSIS — M546 Pain in thoracic spine: Secondary | ICD-10-CM | POA: Diagnosis not present

## 2023-03-15 DIAGNOSIS — M545 Low back pain, unspecified: Secondary | ICD-10-CM | POA: Diagnosis not present

## 2023-03-18 DIAGNOSIS — F411 Generalized anxiety disorder: Secondary | ICD-10-CM | POA: Diagnosis not present

## 2023-04-01 ENCOUNTER — Other Ambulatory Visit (INDEPENDENT_AMBULATORY_CARE_PROVIDER_SITE_OTHER): Payer: Self-pay | Admitting: Family

## 2023-04-01 ENCOUNTER — Telehealth (INDEPENDENT_AMBULATORY_CARE_PROVIDER_SITE_OTHER): Payer: Self-pay | Admitting: Family

## 2023-04-01 DIAGNOSIS — G43009 Migraine without aura, not intractable, without status migrainosus: Secondary | ICD-10-CM

## 2023-04-01 MED ORDER — EMGALITY 120 MG/ML ~~LOC~~ SOAJ: SUBCUTANEOUS | 0 refills | Status: DC

## 2023-04-01 MED ORDER — GABAPENTIN 300 MG PO CAPS: ORAL_CAPSULE | ORAL | 0 refills | Status: DC

## 2023-04-01 MED ORDER — UBRELVY 50 MG PO TABS: ORAL_TABLET | ORAL | 0 refills | Status: DC

## 2023-04-01 NOTE — Telephone Encounter (Signed)
Who's calling (name and relationship to patient) :[place]- Charity fundraiser  Provider they see: [facility]   Reason for call:[place] from [facility] called in for clarificationon Audery's medication. [place] is asking if the  Ubrelvy 50 mg tablet was replacing the Emgality 12 mg/ml soaj    Call ID:      PRESCRIPTION REFILL ONLY  Name of prescription:  Pharmacy:

## 2023-04-02 NOTE — Telephone Encounter (Signed)
I called the pharmacy and clarified the directions. TG

## 2023-04-23 DIAGNOSIS — F330 Major depressive disorder, recurrent, mild: Secondary | ICD-10-CM | POA: Diagnosis not present

## 2023-04-23 DIAGNOSIS — F411 Generalized anxiety disorder: Secondary | ICD-10-CM | POA: Diagnosis not present

## 2023-04-23 DIAGNOSIS — F909 Attention-deficit hyperactivity disorder, unspecified type: Secondary | ICD-10-CM | POA: Diagnosis not present

## 2023-04-23 DIAGNOSIS — Z79899 Other long term (current) drug therapy: Secondary | ICD-10-CM | POA: Diagnosis not present

## 2023-04-23 DIAGNOSIS — Z5181 Encounter for therapeutic drug level monitoring: Secondary | ICD-10-CM | POA: Diagnosis not present

## 2023-04-24 ENCOUNTER — Ambulatory Visit (INDEPENDENT_AMBULATORY_CARE_PROVIDER_SITE_OTHER): Payer: BC Managed Care – PPO | Admitting: Family

## 2023-04-24 ENCOUNTER — Ambulatory Visit (INDEPENDENT_AMBULATORY_CARE_PROVIDER_SITE_OTHER): Payer: Self-pay | Admitting: Family

## 2023-04-24 ENCOUNTER — Encounter (INDEPENDENT_AMBULATORY_CARE_PROVIDER_SITE_OTHER): Payer: Self-pay | Admitting: Family

## 2023-04-24 VITALS — BP 122/60 | HR 74 | Ht 62.52 in | Wt 120.8 lb

## 2023-04-24 DIAGNOSIS — G43009 Migraine without aura, not intractable, without status migrainosus: Secondary | ICD-10-CM | POA: Diagnosis not present

## 2023-04-24 NOTE — Progress Notes (Unsigned)
Latoya Macdonald   MRN:  [id number]  27-Jan-2001   Provider: Elveria Rising NP-C Location of Care: [facility]  Visit type: Return visit  Last visit: 01/08/2023  Referral source: Reola Calkins, NP History from: Epic chart and patient  Brief history:  Copied from previous record: History of daily persistent headaches, migraine and tension headaches. She is taking and tolerating Emgality for migraine prevention. She tapered off Trokendi XR and Gabapentin for migraine prevention when the Emgality gave her improvement in migraine frequency. Gabapentin was restarted in March 2024 when migraines became problematic again.  Today's concerns: At last visit, had increase in migraines and Gabapentin was restarted. Since then migraines have been occurring about once per month and last about one day.  Doing well in school. 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  Arnesia has been otherwise generally healthy since she was last seen. No health concerns today other than previously mentioned.  Review of systems: Please see HPI for neurologic and other pertinent review of systems. Otherwise all other systems were reviewed and were negative.  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   Medications have included cyproheptadine, nortriptyline, amitriptyline, Depakote, Cymbalta, gabapentin, propranolol, atenolol, Migrelief, Topamax, and Zonegran.   Abortive medications have included rizatriptan, sumatriptan, eletriptan, baclofen, multiple over-the-counter nonsteroidals, tizanidine chlorzoxazone, diclofenac.   Nonpharmacologic treatments have included physical therapy, chiropractic manipulation, ultrasound, TENS unit, acupuncture, dry needling.   Diagnoses have included chronic migraine, chronic tension type headache, occipital neuralgia, TMJ dysfunction.   MRI without contrast in October, 2013 which was normal.   On January 10, 2017 an occipital nerve block was performed with bupivacaine and lidocaine.  The patient had immediate numbness.  This did not provide long-term relief.    She suffered a concussion while swimming July 16, 2018 which exacerbated her headaches. She had a CT scan at [facility] which was normal.    Birth History Infant born at [redacted] weeks gestational age to a 20 year old g 1 p 0 female. Gestation was complicated by uterine fibroids, stress and grief over the sudden unexpected death of her husband Mother received Epidural anesthesia Primary cesarean section for failure to progress after 20 hours of labor and 30 minutes of pushing Nursery Course was uncomplicated Growth and Development was recalled as  normal   Behavior History Anxiety and depression  Surgical history: History reviewed. No pertinent surgical history.   Family history: family history includes Migraines in her maternal grandmother.   Social history: Social History   Socioeconomic History   Marital status: Single    Spouse name: Not on file   Number of children:  Not on file   Years of education: Not on file   Highest education level: Not on file   Occupational History   Not on file  Tobacco Use   Smoking status: Never   Smokeless tobacco: Never  Substance and Sexual Activity   Alcohol use: Not on file   Drug use: Not on file   Sexual activity: Not on file  Other Topics Concern   Not on file  Social History Narrative   She is at [employer] on [employer] for her junior year.   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    Social Determinants of Health   Financial Resource Strain: Not on file  Food Insecurity: Not on file  Transportation Needs: Not on file  Physical Activity: Not on file  Stress: Not on file  Social Connections: Not on file  Intimate Partner Violence: Not on file    Past/failed meds: Copied from previous record: Medications have included cyproheptadine, nortriptyline, amitriptyline, Depakote, Cymbalta, gabapentin, propranolol, atenolol, Migrelief, Topamax, and Zonegran.   Abortive medications have included rizatriptan, sumatriptan, eletriptan, baclofen, multiple over-the-counter nonsteroidals, tizanidine chlorzoxazone, diclofenac.   Nonpharmacologic treatments have included physical therapy, chiropractic manipulation, ultrasound, TENS unit, acupuncture, dry needling.  Allergies: Allergies  Allergen Reactions   Gluten Meal     Intolerance not celiac    Immunizations:  There is no immunization history on file for this patient.   Diagnostics/Screenings: Copied from previous record: 11/25/2020 - MRI Cervical Spine W/WO contrast - Mildly motion degraded, but otherwise normal MRI of the cervical spine.  11/25/2020 - MRI Brain W/WO contrast - Normal brain MRI  Physical Exam: BP 122/60 (BP Location: Left Arm, Patient Position: Sitting, Cuff Size: Normal)   Pulse 74   Ht 5' 2.52" (1.588 m)   Wt 120 lb 12.8 oz (54.8 kg)   LMP 04/02/2023 (Exact Date)   BMI 21.73 kg/m   General: Well developed, well nourished young woman, seated in exam room, in no evident  distress Head: Head normocephalic and atraumatic.  Oropharynx benign. Neck: Supple Cardiovascular: Regular rate and rhythm, no murmurs Respiratory: Breath sounds clear to auscultation Musculoskeletal: No obvious deformities or scoliosis Skin: No rashes or neurocutaneous lesions  Neurologic Exam Mental Status: Awake and fully alert.  Oriented to place and time.  Recent and remote memory intact.  Attention span, concentration, and fund of knowledge appropriate.  Mood and affect appropriate. Cranial Nerves: Fundoscopic exam reveals sharp disc margins.  Pupils equal, briskly reactive to light.  Extraocular movements full without nystagmus. Hearing intact and symmetric to whisper.  Facial sensation intact.  Face tongue, palate move normally and symmetrically. Shoulder shrug normal Motor: Normal bulk and tone. Normal strength in all tested extremity muscles. Sensory: Intact to touch and temperature in all extremities.  Coordination: Rapid alternating movements normal in all extremities.  Finger-to-nose and heel-to shin performed accurately bilaterally.  Romberg negative. Gait and Station: Arises from chair without difficulty.  Stance is normal. Gait demonstrates normal stride length and balance.   Able to heel, toe and tandem walk without difficulty. Reflexes: 1+ and symmetric. Toes downgoing.   Impression: Migraine without aura, not intractable, without status migrainosus   Recommendations for plan of care: The patient's previous Epic records were reviewed. No recent diagnostic studies to be reviewed with the patient.  Plan until next visit: Continue medications as prescribed  Reminded to avoid skipping meals, to drink plenty of water each day and to get at least 8  hours of sleep each night as these measures are known to reduce how often headaches occur. Call for questions or concerns Return in about 6 months (around 10/24/2023).  The medication list was reviewed and reconciled. No changes  were made in the prescribed medications today. A complete medication list was provided to the patient.  No orders of the defined types were placed in this encounter.    Allergies as of 04/24/2023       Reactions   Gluten Meal    Intolerance not celiac        Medication List        Accurate as of April 24, 2023 11:59 PM. If you have any questions, ask your nurse or doctor.          STOP taking these medications    Advair HFA 45-21 MCG/ACT inhaler Generic drug: fluticasone-salmeterol Stopped by: Elveria Rising, NP   albuterol 108 (90 Base) MCG/ACT inhaler Commonly known as: VENTOLIN HFA Stopped by: Elveria Rising, NP   Azelastine HCl 137 MCG/SPRAY Soln Stopped by: Elveria Rising, NP   montelukast 10 MG tablet Commonly known as: SINGULAIR Stopped by: Elveria Rising, NP       TAKE these medications    amphetamine-dextroamphetamine 10 MG tablet Commonly known as: ADDERALL Take 10 mg by mouth 2 (two) times daily.   busPIRone 5 MG tablet Commonly known as: BUSPAR Take 5 mg by mouth 2 (two) times daily.   cetirizine 10 MG tablet Commonly known as: ZYRTEC Take by mouth.   desvenlafaxine 100 MG 24 hr tablet Commonly known as: PRISTIQ Take 100 mg by mouth daily.   dextroamphetamine 10 MG 24 hr capsule Commonly known as: DEXEDRINE SPANSULE Take 10 mg by mouth 2 (two) times daily.   Emgality 120 MG/ML Soaj Generic drug: Galcanezumab-gnlm Inject 120 mg under the skin every 30 days.   gabapentin 300 MG capsule Commonly known as: NEURONTIN Take 1 capsule twice per day   loratadine 10 MG tablet Commonly known as: CLARITIN Take 10 mg by mouth daily.   Nexplanon 68 MG Impl implant Generic drug: etonogestrel 68 mg by Subdermal route. Insertion date:02/03/2020   traZODone 50 MG tablet Commonly known as: DESYREL   Ubrelvy 50 MG Tabs Generic drug: Ubrogepant Take 1 tablet at onset of migraine. May take 1 additional tablet in 2 hours if migraine  persists. Do not take more than 2 tablets in 24 hours.      Total time spent with the patient was 20 minutes, of which 50% or more was spent in counseling and coordination of care.  Elveria Rising NP-C [facility] [address] [place], [place] [address] Ph. [phone] Fax [phone]

## 2023-04-25 ENCOUNTER — Encounter (INDEPENDENT_AMBULATORY_CARE_PROVIDER_SITE_OTHER): Payer: Self-pay | Admitting: Family

## 2023-04-25 NOTE — Patient Instructions (Signed)
It was a pleasure to see you today!  Instructions for you until your next appointment are as follows: Continue your medication as prescribed Remember that it is important for you to avoid skipping meals, to drink plenty of water each day and to get at least 9 hours of sleep each night as these things are known to reduce how often headaches occur.   Please sign up for MyChart if you have not done so. Please plan to return for follow up in 6 months or sooner if needed.  Feel free to contact our office during normal business hours at [phone] with questions or concerns. If there is no answer or the call is outside business hours, please leave a message and our clinic staff will call you back within the next business day.  If you have an urgent concern, please stay on the line for our after-hours answering service and ask for the on-call neurologist.     I also encourage you to use MyChart to communicate with me more directly. If you have not yet signed up for MyChart within [facility], the front desk staff can help you. However, please note that this inbox is NOT monitored on nights or weekends, and response can take up to 2 business days.  Urgent matters should be discussed with the on-call pediatric neurologist.   At [facility], we are committed to providing exceptional care. You will receive a patient satisfaction survey through text or email regarding your visit today. Your opinion is important to me. Comments are appreciated.

## 2023-05-13 DIAGNOSIS — L249 Irritant contact dermatitis, unspecified cause: Secondary | ICD-10-CM | POA: Diagnosis not present

## 2023-05-13 DIAGNOSIS — L700 Acne vulgaris: Secondary | ICD-10-CM | POA: Diagnosis not present

## 2023-05-13 DIAGNOSIS — L718 Other rosacea: Secondary | ICD-10-CM | POA: Diagnosis not present

## 2023-05-30 ENCOUNTER — Telehealth (INDEPENDENT_AMBULATORY_CARE_PROVIDER_SITE_OTHER): Payer: Self-pay

## 2023-05-30 NOTE — Telephone Encounter (Signed)
Call to [employer] spoke with Latoya Macdonald- 20 min call. Their pharm has the same Key RN has but wonder if it is related to Latoya Macdonald name. They are not sure if Latoya Macdonald name is Latoya Macdonald or if Latoya Macdonald is the middle name. RN adv on Cover My Meds it came up as Latoya Macdonald. And allowed send to plan. RN has sent message to patient to follow up with [place] or Latoya Macdonald insurance to determine the issue.

## 2023-05-30 NOTE — Telephone Encounter (Signed)
Found form online for high cost medication- complete pages 5-7- placed in Tina's box.

## 2023-05-30 NOTE — Telephone Encounter (Signed)
Latoya Macdonald (Key: [id number]) [employer] [place] is processing your PA request and will respond shortly with next steps. You may close this dialog, return to your dashboard, and perform other tasks. To check for an update later, open this request again from your dashboard.

## 2023-05-30 NOTE — Telephone Encounter (Signed)
Call to patient to determine if she has a different insurance or pharm plan. RN submitted PA website allowed it to be sent but came back as could not find member though used Cover My Meds key.

## 2023-06-03 ENCOUNTER — Other Ambulatory Visit (INDEPENDENT_AMBULATORY_CARE_PROVIDER_SITE_OTHER): Payer: Self-pay | Admitting: Family

## 2023-06-03 DIAGNOSIS — G43009 Migraine without aura, not intractable, without status migrainosus: Secondary | ICD-10-CM

## 2023-06-03 MED ORDER — UBRELVY 50 MG PO TABS: ORAL_TABLET | ORAL | 5 refills | Status: DC

## 2023-06-03 NOTE — Telephone Encounter (Signed)
I called [employer] today and was told that the Bernita Raisin was approved. TG

## 2023-06-25 ENCOUNTER — Telehealth (INDEPENDENT_AMBULATORY_CARE_PROVIDER_SITE_OTHER): Payer: Self-pay

## 2023-06-25 NOTE — Telephone Encounter (Signed)
PA requested for [facility] Case: [id number] Submited on Cover My Meds

## 2023-07-02 ENCOUNTER — Other Ambulatory Visit (INDEPENDENT_AMBULATORY_CARE_PROVIDER_SITE_OTHER): Payer: Self-pay

## 2023-07-02 DIAGNOSIS — G43009 Migraine without aura, not intractable, without status migrainosus: Secondary | ICD-10-CM

## 2023-07-02 MED ORDER — GABAPENTIN 300 MG PO CAPS: ORAL_CAPSULE | ORAL | 2 refills | Status: DC

## 2023-07-10 DIAGNOSIS — J069 Acute upper respiratory infection, unspecified: Secondary | ICD-10-CM | POA: Diagnosis not present

## 2023-08-09 DIAGNOSIS — J3081 Allergic rhinitis due to animal (cat) (dog) hair and dander: Secondary | ICD-10-CM | POA: Diagnosis not present

## 2023-08-09 DIAGNOSIS — J3089 Other allergic rhinitis: Secondary | ICD-10-CM | POA: Diagnosis not present

## 2023-08-09 DIAGNOSIS — J4530 Mild persistent asthma, uncomplicated: Secondary | ICD-10-CM | POA: Diagnosis not present

## 2023-08-09 DIAGNOSIS — J301 Allergic rhinitis due to pollen: Secondary | ICD-10-CM | POA: Diagnosis not present

## 2023-08-13 ENCOUNTER — Telehealth (INDEPENDENT_AMBULATORY_CARE_PROVIDER_SITE_OTHER): Payer: Self-pay | Admitting: Family

## 2023-08-13 NOTE — Telephone Encounter (Signed)
I received a PA request from the pharmacy for [facility]. When I contacted the insurance called [facility] - I was told that Annistyn's primary insurance had to approve it in order for the pharmacy insurance to pay. I tried doing the PA on Covermymeds and it came back as "NA". When I called [employer] [place], I was told that the appeal that was initiated in August was still pending and that they had until 10/21 to make a decision. TG

## 2023-09-17 ENCOUNTER — Other Ambulatory Visit (INDEPENDENT_AMBULATORY_CARE_PROVIDER_SITE_OTHER): Payer: Self-pay

## 2023-09-17 DIAGNOSIS — G43009 Migraine without aura, not intractable, without status migrainosus: Secondary | ICD-10-CM

## 2023-09-17 MED ORDER — EMGALITY 120 MG/ML ~~LOC~~ SOAJ: SUBCUTANEOUS | 0 refills | Status: DC

## 2023-09-19 ENCOUNTER — Encounter (INDEPENDENT_AMBULATORY_CARE_PROVIDER_SITE_OTHER): Payer: Self-pay

## 2023-09-19 DIAGNOSIS — G43009 Migraine without aura, not intractable, without status migrainosus: Secondary | ICD-10-CM

## 2023-09-19 MED ORDER — EMGALITY 120 MG/ML ~~LOC~~ SOAJ: SUBCUTANEOUS | 0 refills | Status: DC

## 2023-10-01 MED ORDER — EMGALITY 120 MG/ML ~~LOC~~ SOAJ: SUBCUTANEOUS | 0 refills | Status: DC

## 2023-10-01 NOTE — Addendum Note (Signed)
Addended by: Princella Ion on: 10/01/2023 12:25 PM   Modules accepted: Orders

## 2023-10-11 HISTORY — PX: WISDOM TOOTH EXTRACTION: SHX21

## 2023-10-17 ENCOUNTER — Encounter (INDEPENDENT_AMBULATORY_CARE_PROVIDER_SITE_OTHER): Payer: Self-pay | Admitting: Family

## 2023-10-17 ENCOUNTER — Ambulatory Visit (INDEPENDENT_AMBULATORY_CARE_PROVIDER_SITE_OTHER): Payer: BC Managed Care – PPO | Admitting: Family

## 2023-10-17 VITALS — BP 122/80 | HR 96 | Ht 62.21 in | Wt 121.0 lb

## 2023-10-17 DIAGNOSIS — F32A Depression, unspecified: Secondary | ICD-10-CM

## 2023-10-17 DIAGNOSIS — G43009 Migraine without aura, not intractable, without status migrainosus: Secondary | ICD-10-CM | POA: Diagnosis not present

## 2023-10-17 DIAGNOSIS — F419 Anxiety disorder, unspecified: Secondary | ICD-10-CM

## 2023-10-17 NOTE — Progress Notes (Signed)
Latoya Macdonald   MRN:  [id number]  Aug 22, 2001   Provider: Elveria Rising NP-C Location of Care: [facility]  Visit type: Return visit  Last visit: 04/24/2023  Referral source: Reola Calkins, NP History from: Epic chart and patient  Brief history:  Copied from previous record: History of daily persistent headaches, migraine and tension headaches. She is taking and tolerating Emgality for migraine prevention. She tapered off Trokendi XR and Gabapentin for migraine prevention when the Emgality gave her improvement in migraine frequency. Gabapentin was restarted in March 2024 when migraines became problematic again.   Today's concerns: She reports today that she has had intermittent migraine headaches that require medication and sleep to resolve but that overall the migraine frequency has improved She is doing well in college but admits to stress at times triggering headaches She denies missed meals. She says that she drinks water during the day and typically gets enough sleep. Cedrika has been otherwise generally healthy since she was last seen. No health concerns today other than previously mentioned.  Review of systems: Please see HPI for neurologic and other pertinent review of systems. Otherwise all other systems were reviewed and were negative.  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   Medications have included cyproheptadine, nortriptyline, amitriptyline, Depakote, Cymbalta, gabapentin, propranolol, atenolol, Migrelief, Topamax, and Zonegran.   Abortive medications have included rizatriptan, sumatriptan, eletriptan, baclofen, multiple over-the-counter nonsteroidals, tizanidine chlorzoxazone, diclofenac.   Nonpharmacologic treatments have included physical therapy, chiropractic manipulation, ultrasound, TENS unit, acupuncture, dry needling.   Diagnoses have included chronic migraine, chronic tension type headache, occipital neuralgia, TMJ dysfunction.   MRI without contrast in October, 2013 which was normal.   On January 10, 2017 an occipital nerve block was performed with bupivacaine and lidocaine.  The patient had immediate numbness.  This did not provide long-term relief.    She suffered a concussion while swimming July 16, 2018 which exacerbated her headaches. She had a CT scan at [facility] which was normal.    Birth History Infant born at [redacted] weeks gestational age to a 20 year old g 1 p 0 female. Gestation was complicated by uterine fibroids, stress and grief over the sudden unexpected death of her husband Mother received Epidural anesthesia Primary cesarean section for failure to progress after 20 hours of labor and 30 minutes of pushing Nursery Course was uncomplicated Growth and Development was recalled as  normal   Behavior History Anxiety and depression  Surgical history: No past surgical history on file.   Family history: family history includes Migraines in her maternal grandmother.   Social history: Social History   Socioeconomic History   Marital status: Single    Spouse name: Not on file   Number of children: Not on file  Years of education: Not on file   Highest education level: Not on file  Occupational History    Not on file  Tobacco Use   Smoking status: Never   Smokeless tobacco: Never  Substance and Sexual Activity   Alcohol use: Not on file   Drug use: Not on file   Sexual activity: Not on file  Other Topics Concern   Not on file  Social History Narrative   She is at [employer] on [employer] for her junior year.   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    Social Drivers of Corporate investment banker Strain: Not on file  Food Insecurity: Not on file  Transportation Needs: Not on file  Physical Activity: Not on file  Stress: Not on file  Social Connections: Unknown (03/12/2022)   Received [facility]   Social Network    Social Network: Not on file  Intimate Partner Violence: Unknown (02/01/2022)   Received from [facility]   HITS    Physically Hurt: Not on file    Insult or Talk Down To: Not on file    Threaten Physical Harm: Not on file    Scream or Curse: Not on file    Past/failed meds: Copied from previous record: Medications have included cyproheptadine, nortriptyline, amitriptyline, Depakote, Cymbalta, gabapentin, propranolol, atenolol, Migrelief, Topamax, and Zonegran.   Abortive medications have included rizatriptan, sumatriptan, eletriptan, baclofen, multiple over-the-counter nonsteroidals, tizanidine chlorzoxazone, diclofenac.   Nonpharmacologic treatments have included physical therapy, chiropractic manipulation, ultrasound, TENS unit, acupuncture, dry needling. Allergies: Allergies  Allergen Reactions   Gluten Meal     Intolerance not celiac   Immunizations:  There is no immunization history on file for this patient.   Diagnostics/Screenings: Copied from previous record: 11/25/2020 - MRI Cervical Spine W/WO contrast - Mildly motion degraded, but otherwise normal MRI of the cervical spine.  11/25/2020 - MRI Brain W/WO contrast - Normal brain MRI  Physical Exam: BP 122/80 (BP  Location: Right Arm, Patient Position: Sitting, Cuff Size: Normal)   Pulse 96   Ht 5' 2.21" (1.58 m)   Wt 121 lb (54.9 kg)   BMI 21.99 kg/m   General: Well developed, well nourished young woman, seated on exam table, in no evident distress Head: Head normocephalic and atraumatic.  Oropharynx benign. Neck: Supple Cardiovascular: Regular rate and rhythm, no murmurs Respiratory: Breath sounds clear to auscultation Musculoskeletal: No obvious deformities or scoliosis Skin: No rashes or neurocutaneous lesions  Neurologic Exam Mental Status: Awake and fully alert.  Oriented to place and time.  Recent and remote memory intact.  Attention span, concentration, and fund of knowledge appropriate.  Mood and affect appropriate. Cranial Nerves: Fundoscopic exam reveals sharp disc margins.  Pupils equal, briskly reactive to light.  Extraocular movements full without nystagmus. Hearing intact and symmetric to whisper.  Facial sensation intact.  Face tongue, palate move normally and symmetrically. Shoulder shrug normal Motor: Normal bulk and tone. Normal strength in all tested extremity muscles. Sensory: Intact to touch and temperature in all extremities.  Coordination: Rapid alternating movements normal in all extremities.  Finger-to-nose and heel-to shin performed accurately bilaterally.  Romberg negative. Gait and Station: Arises from chair without difficulty.  Stance is normal. Gait demonstrates normal stride length and balance.   Able to heel, toe and tandem walk without difficulty. Reflexes: 1+ and symmetric. Toes downgoing.   Impression: Migraine without aura, not intractable, without status migrainosus  Anxiety and depression   Recommendations for plan of care: The patient's previous Epic records were reviewed. No recent diagnostic studies to be reviewed with the patient.  Plan until next visit: Continue medications as prescribed. She denies needing refills today Reminded to avoid skipping  meals, to drink plenty of water each day and to get at least 8 hours of sleep each night.  Call for questions or concerns Return in about 6 months (around 04/16/2024).  The medication list was reviewed and reconciled. No changes were made in the prescribed medications today. A complete medication list was provided to the patient.  Allergies as of 10/17/2023       Reactions   Gluten Meal    Intolerance not celiac        Medication List        Accurate as of October 17, 2023  9:18 AM. If you have any questions, ask your nurse or doctor.          amphetamine-dextroamphetamine 10 MG tablet Commonly known as: ADDERALL Take 10 mg by mouth 2 (two) times daily.   busPIRone 5 MG tablet Commonly known as: BUSPAR Take 5 mg by mouth 2 (two) times daily.   cetirizine 10 MG tablet Commonly known as: ZYRTEC Take by mouth.   desvenlafaxine 100 MG 24 hr tablet Commonly known as: PRISTIQ Take 100 mg by mouth daily.   dextroamphetamine 10 MG 24 hr capsule Commonly known as: DEXEDRINE SPANSULE Take 10 mg by mouth 2 (two) times daily.   Emgality 120 MG/ML Soaj Generic drug: Galcanezumab-gnlm Inject 120 mg under the skin every 30 days.   gabapentin 300 MG capsule Commonly known as: NEURONTIN Take 1 capsule twice per day   loratadine 10 MG tablet Commonly known as: CLARITIN Take 10 mg by mouth daily.   Nexplanon 68 MG Impl implant Generic drug: etonogestrel 68 mg by Subdermal route. Insertion date:02/03/2020   traZODone 50 MG tablet Commonly known as: DESYREL   Ubrelvy 50 MG Tabs Generic drug: Ubrogepant Take 1 tablet at onset of migraine. May take 1 additional tablet in 2 hours if migraine persists. Do not take more than 2 tablets in 24 hours.      Total time spent with the patient was 20 minutes, of which 50% or more was spent in counseling and coordination of care.  Elveria Rising NP-C [facility] [address]  [address] [place], [place] [address] Ph. [phone] Fax [phone]

## 2023-10-17 NOTE — Patient Instructions (Signed)
It was a pleasure to see you today!  Instructions for you until your next appointment are as follows: Continue your medications as prescribed Let me know if your headaches become more frequent or more severe Remember that it is important for you to avoid skipping meals, to drink plenty of water each day and to get at least 9 hours of sleep each night as these things are known to reduce how often headaches occur.   Please sign up for MyChart if you have not done so. Please plan to return for follow up in 6 months or sooner if needed.  Feel free to contact our office during normal business hours at [phone] with questions or concerns. If there is no answer or the call is outside business hours, please leave a message and our clinic staff will call you back within the next business day.  If you have an urgent concern, please stay on the line for our after-hours answering service and ask for the on-call neurologist.     I also encourage you to use MyChart to communicate with me more directly. If you have not yet signed up for MyChart within [facility], the front desk staff can help you. However, please note that this inbox is NOT monitored on nights or weekends, and response can take up to 2 business days.  Urgent matters should be discussed with the on-call pediatric neurologist.   At [facility], we are committed to providing exceptional care. You will receive a patient satisfaction survey through text or email regarding your visit today. Your opinion is important to me. Comments are appreciated.

## 2023-10-18 ENCOUNTER — Encounter (INDEPENDENT_AMBULATORY_CARE_PROVIDER_SITE_OTHER): Payer: Self-pay | Admitting: Family

## 2023-10-21 DIAGNOSIS — F411 Generalized anxiety disorder: Secondary | ICD-10-CM | POA: Diagnosis not present

## 2023-10-21 DIAGNOSIS — F909 Attention-deficit hyperactivity disorder, unspecified type: Secondary | ICD-10-CM | POA: Diagnosis not present

## 2023-10-21 DIAGNOSIS — F330 Major depressive disorder, recurrent, mild: Secondary | ICD-10-CM | POA: Diagnosis not present

## 2023-11-04 DIAGNOSIS — L700 Acne vulgaris: Secondary | ICD-10-CM | POA: Diagnosis not present

## 2023-11-06 DIAGNOSIS — Z30011 Encounter for initial prescription of contraceptive pills: Secondary | ICD-10-CM | POA: Diagnosis not present

## 2023-11-06 DIAGNOSIS — N939 Abnormal uterine and vaginal bleeding, unspecified: Secondary | ICD-10-CM | POA: Diagnosis not present

## 2023-11-24 ENCOUNTER — Other Ambulatory Visit (INDEPENDENT_AMBULATORY_CARE_PROVIDER_SITE_OTHER): Payer: Self-pay | Admitting: Family

## 2023-11-24 DIAGNOSIS — G43009 Migraine without aura, not intractable, without status migrainosus: Secondary | ICD-10-CM

## 2023-12-12 ENCOUNTER — Encounter (INDEPENDENT_AMBULATORY_CARE_PROVIDER_SITE_OTHER): Payer: Self-pay

## 2023-12-12 DIAGNOSIS — G43901 Migraine, unspecified, not intractable, with status migrainosus: Secondary | ICD-10-CM

## 2023-12-12 MED ORDER — METHYLPREDNISOLONE 4 MG PO TBPK: ORAL_TABLET | ORAL | 0 refills | Status: DC

## 2023-12-12 MED ORDER — PROMETHAZINE HCL 12.5 MG PO TABS: 12.5000 mg | ORAL_TABLET | Freq: Three times a day (TID) | ORAL | 0 refills | Status: DC | PRN

## 2023-12-12 MED ORDER — KETOROLAC TROMETHAMINE 10 MG PO TABS: ORAL_TABLET | ORAL | 0 refills | Status: DC

## 2024-02-07 ENCOUNTER — Other Ambulatory Visit (INDEPENDENT_AMBULATORY_CARE_PROVIDER_SITE_OTHER): Payer: Self-pay | Admitting: Family

## 2024-02-07 DIAGNOSIS — G43009 Migraine without aura, not intractable, without status migrainosus: Secondary | ICD-10-CM

## 2024-03-25 DIAGNOSIS — Z30011 Encounter for initial prescription of contraceptive pills: Secondary | ICD-10-CM | POA: Diagnosis not present

## 2024-03-25 DIAGNOSIS — Z3046 Encounter for surveillance of implantable subdermal contraceptive: Secondary | ICD-10-CM | POA: Diagnosis not present

## 2024-03-25 DIAGNOSIS — N939 Abnormal uterine and vaginal bleeding, unspecified: Secondary | ICD-10-CM | POA: Diagnosis not present

## 2024-04-15 NOTE — Progress Notes (Unsigned)
 Latoya Macdonald   MRN:  [id number]  [birth year]   Provider: Lyndol Santee NP-C Location of Care: [facility]  Visit type: Return visit  Last visit: 10/17/2023  Referral source: Alfredo Ano, NP History from: Epic chart and patient  Brief history:  Copied from previous record: History of daily persistent headaches, migraine and tension headaches. She is taking and tolerating Emgality  for migraine prevention. She tapered off Trokendi  XR and Gabapentin  for migraine prevention when the Emgality  gave her improvement in migraine frequency. Gabapentin  was restarted in March 2024 when migraines became problematic again.   Today's concerns: She reports today that she has experienced fewer migraines overall since being on Emgality . She estimates about 1 migraine per month on average. Unfortunately when migraines occur, sometimes they are severe or can last for several days.  She does not feel that Ubrelvy  has been helpful, even when taken at the onset of migraine symptoms She denies skipped meals, drinks a large amount of water each day and stays on a sleep schedule. She admits to some stress related to college classes but over all avoids known triggers for migraines.  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  Latoya Macdonald has been otherwise generally healthy since she was last seen. No health concerns today other than previously mentioned.  Review of systems: Please see HPI for neurologic and other pertinent review of systems. Otherwise all other systems were reviewed and were negative.  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   Medications have included cyproheptadine, nortriptyline, amitriptyline, Depakote, Cymbalta, gabapentin , propranolol, atenolol, Migrelief, Topamax , and Zonegran .   Abortive medications have included rizatriptan, sumatriptan, eletriptan, baclofen, multiple over-the-counter nonsteroidals, tizanidine chlorzoxazone, diclofenac.   Nonpharmacologic treatments have included physical therapy, chiropractic manipulation, ultrasound, TENS unit, acupuncture, dry needling.   Diagnoses have included chronic migraine, chronic tension type headache, occipital neuralgia, TMJ dysfunction.   MRI without contrast in October, 2013 which was normal.   On January 10, 2017 an occipital nerve block was performed with bupivacaine and lidocaine.  The patient had immediate numbness.  This did not provide long-term relief.    She suffered a concussion while swimming July 16, 2018 which exacerbated her headaches. She had a CT scan at [facility] which was normal.    Birth History Infant born at [redacted] weeks gestational age to a 21 year old g 1 p 0 female. Gestation was complicated by uterine fibroids, stress and grief over the sudden unexpected death of her husband Mother received Epidural anesthesia Primary cesarean section for failure to progress after 20 hours of labor and 30 minutes of pushing Nursery  Course was uncomplicated Growth and Development was recalled as  normal   Behavior History Anxiety and  depression  Surgical history: Past Surgical History:  Procedure Laterality Date   WISDOM TOOTH EXTRACTION  10/11/2023   All were extracted.     Family history: family history includes Migraines in her maternal grandmother.   Social history: Social History   Socioeconomic History   Marital status: Single    Spouse name: Not on file   Number of children: Not on file   Years of education: Not on file   Highest education level: Not on file  Occupational History   Not on file  Tobacco Use   Smoking status: Never   Smokeless tobacco: Never  Substance and Sexual Activity   Alcohol use: Not on file   Drug use: Not on file   Sexual activity: Not on file  Other Topics Concern   Not on file  Social History Narrative   She is at [employer] for her junior year.   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    Social Drivers of Corporate investment banker Strain: Not on file  Food Insecurity: Not on file  Transportation Needs: Not on file  Physical Activity: Not on file  Stress: Not on file  Social Connections: Unknown (03/12/2022)   [facility]   Social Network    Social Network: Not on file  Intimate Partner Violence: Unknown (02/01/2022)   Received from [facility]   HITS    Physically Hurt: Not on file    Insult or Talk Down To: Not on file    Threaten Physical Harm: Not on file    Scream or Curse: Not on file    Past/failed meds: Copied from previous record: Medications have included cyproheptadine, nortriptyline, amitriptyline, Depakote, Cymbalta, gabapentin , propranolol, atenolol, Migrelief, Topamax , and Zonegran .   Abortive medications have included rizatriptan, sumatriptan, eletriptan, baclofen, multiple over-the-counter nonsteroidals, tizanidine chlorzoxazone, diclofenac and Ubrelvy    Nonpharmacologic treatments have included physical therapy, chiropractic manipulation, ultrasound, TENS unit,  acupuncture, dry needling.  Allergies: Allergies  Allergen Reactions   Gluten Meal     Intolerance not celiac    Immunizations:  There is no immunization history on file for this patient.   Diagnostics/Screenings: Copied from previous record: 11/25/2020 - MRI Cervical Spine W/WO contrast - Mildly motion degraded, but otherwise normal MRI of the cervical spine.  11/25/2020 - MRI Brain W/WO contrast - Normal brain MRI  Physical Exam: BP 120/70   Pulse (!) 116   Ht 5' 2.6 (1.59 m)   Wt 123 lb 3.2 oz (55.9 kg)   SpO2 98%   BMI 22.11 kg/m   General: Well developed, well nourished young woman, seated on exam table, in no evident distress Head: Head normocephalic and atraumatic.  Oropharynx benign. Neck: Supple Cardiovascular: Regular rate and rhythm, no murmurs Respiratory: Breath sounds clear to auscultation Musculoskeletal: No obvious deformities or scoliosis Skin: No rashes or neurocutaneous lesions  Neurologic Exam Mental Status: Awake and fully alert.  Oriented to place and time.  Recent and remote memory intact.  Attention span, concentration, and fund of knowledge appropriate.  Mood and affect appropriate. Cranial Nerves: Fundoscopic exam reveals sharp disc margins.  Pupils equal, briskly reactive to light.  Extraocular movements full without nystagmus. Hearing intact and symmetric to whisper.  Facial sensation intact.  Face tongue, palate move normally and symmetrically. Shoulder shrug normal Motor: Normal bulk and tone.  Normal strength in all tested extremity muscles. Sensory: Intact to touch and temperature in all extremities.  Coordination: Rapid alternating movements normal in all extremities.  Finger-to-nose and heel-to shin performed accurately bilaterally.  Romberg negative. Gait and Station: Arises from chair without difficulty.  Stance is normal. Gait demonstrates normal stride length and balance.   Able to heel, toe and tandem walk without  difficulty.  Impression: Migraine without aura, not intractable, without status migrainosus - Plan: NURTEC 75 MG TBDP   Recommendations for plan of care: The patient's previous Epic records were reviewed. No recent diagnostic studies to be reviewed with the patient. I talked with Canyon about her migraines. Since the Ubrelvy  is not giving her reliable relief, I recommended switching to Nurtec ODT for acute treatment. She will continue her mother medications as prescribed for now.  Plan until next visit: Stop Ubrelvy .  Start Nurtec ODT for acute treatment of migraine Continue other medications as prescribed  Reminded to continue to avoid known migraine triggers.  Call for questions or concerns Return in about 6 months (around 10/16/2024).  The medication list was reviewed and reconciled. I reviewed the changes that were made in the prescribed medications today. A complete medication list was provided to the patient.  Allergies as of 04/16/2024       Reactions   Gluten Meal    Intolerance not celiac        Medication List        Accurate as of April 16, 2024  1:17 PM. If you have any questions, ask your nurse or doctor.          STOP taking these medications    Nexplanon  68 MG Impl implant Generic drug: etonogestrel  Stopped by: Lyndol Santee   Ubrelvy  50 MG Tabs Generic drug: Ubrogepant  Stopped by: Lyndol Santee       TAKE these medications    Advair HFA 45-21 MCG/ACT inhaler Generic drug: fluticasone-salmeterol 2 puffs Inhalation Twice a day; Duration: 30   albuterol 108 (90 Base) MCG/ACT inhaler Commonly known as: VENTOLIN HFA 2 puffs Inhalation q 4-6 hours prn cough/wheeze; Duration: 90 days   alclomethasone 0.05 % cream Commonly known as: ACLOVATE Apply topically.   amphetamine-dextroamphetamine 10 MG tablet Commonly known as: ADDERALL Take 10 mg by mouth 2 (two) times daily.   Azelaic Acid 15 % gel   Azelastine HCl 137 MCG/SPRAY Soln 1 spray  ea nostril Nasally Twice a day; Duration: 90 days   busPIRone 5 MG tablet Commonly known as: BUSPAR Take 5 mg by mouth 2 (two) times daily.   cephALEXin 500 MG capsule Commonly known as: KEFLEX Take 500 mg by mouth 2 (two) times daily.   cetirizine 10 MG tablet Commonly known as: ZYRTEC Take by mouth.   clindamycin 1 % external solution Commonly known as: CLEOCIN T Apply topically.   desogestrel-ethinyl estradiol 0.15-0.02/0.01 MG (21/5) tablet Commonly known as: MIRCETTE Take 1 tablet by mouth daily.   desvenlafaxine 100 MG 24 hr tablet Commonly known as: PRISTIQ Take 100 mg by mouth daily.   dextroamphetamine 10 MG 24 hr capsule Commonly known as: DEXEDRINE SPANSULE Take 10 mg by mouth 2 (two) times daily.   Emgality  120 MG/ML Soaj Generic drug: Galcanezumab -gnlm Inject 120 mg under the skin every 30 days. This is in addition to Ubrelvy  which is for rescue treatment of migraine.   gabapentin  300 MG capsule Commonly known as: NEURONTIN  Take 1 capsule by mouth twice daily.   ketorolac  10 MG tablet Commonly known  as: TORADOL  Take 1 tablet every 8 hours for 24 hours   loratadine 10 MG tablet Commonly known as: CLARITIN Take 10 mg by mouth daily.   loratadine-pseudoephedrine 10-240 MG 24 hr tablet Commonly known as: CLARITIN-D 24-hour Take 1 tablet by mouth.   methylPREDNISolone  4 MG Tbpk tablet Commonly known as: MEDROL  DOSEPAK Give dose pack as directed for 6 days   Nurtec 75 MG Tbdp Generic drug: Rimegepant Sulfate Take 1 tablet at onset of migraine. May repeat once in 24 hours if migraine persists Started by: Lyndol Santee   promethazine  12.5 MG tablet Commonly known as: PHENERGAN  Take 1 tablet (12.5 mg total) by mouth every 8 (eight) hours as needed for nausea or vomiting.   traZODone 50 MG tablet Commonly known as: DESYREL      Total time spent with the patient was 25 minutes, of which 50% or more was spent in counseling and coordination of  care.  Lyndol Santee NP-C [facility] [address], [address] [place], [place] [address] Ph. [phone] Fax [phone]

## 2024-04-16 ENCOUNTER — Other Ambulatory Visit (HOSPITAL_COMMUNITY): Payer: Self-pay

## 2024-04-16 ENCOUNTER — Encounter (INDEPENDENT_AMBULATORY_CARE_PROVIDER_SITE_OTHER): Payer: Self-pay | Admitting: Family

## 2024-04-16 ENCOUNTER — Telehealth: Payer: Self-pay | Admitting: Pharmacy Technician

## 2024-04-16 ENCOUNTER — Ambulatory Visit (INDEPENDENT_AMBULATORY_CARE_PROVIDER_SITE_OTHER): Payer: BC Managed Care – PPO | Admitting: Family

## 2024-04-16 VITALS — BP 120/70 | HR 116 | Ht 62.6 in | Wt 123.2 lb

## 2024-04-16 DIAGNOSIS — G43009 Migraine without aura, not intractable, without status migrainosus: Secondary | ICD-10-CM

## 2024-04-16 MED ORDER — NURTEC 75 MG PO TBDP: ORAL_TABLET | ORAL | 5 refills | Status: DC

## 2024-04-16 NOTE — Telephone Encounter (Signed)
 Pharmacy Patient Advocate Encounter  Received notification from [facility] that Prior Authorization for Nurtec 75MG  dispersible tablets has been APPROVED from 04/16/2024 to 07/09/2024   PA #/Case ID/Reference #: [id number]

## 2024-04-16 NOTE — Telephone Encounter (Signed)
 Pharmacy Patient Advocate Encounter   Received notification from CoverMyMeds that prior authorization for Nurtec 75MG  dispersible tablets is required/requested.   Insurance verification completed.   The patient is insured through [facility] .   Per test claim: PA required; PA submitted to above mentioned insurance via CoverMyMeds Key/confirmation #/EOC [id number] Status is pending

## 2024-04-16 NOTE — Patient Instructions (Addendum)
 It was a pleasure to see you today!  Instructions for you until your next appointment are as follows: Continue Emgality  injections every month for migraine prevention Continue your other medications as prescribed as well Stop Ubrelvy . We will try Nurtec ODT for acute treatment when migraines occur. Take 1 tablet at the onset of migraine. If the migraine is still present the following day, you can take another tablet. Do not take more than 1 tablet per day. Remember that it is important for you to avoid skipping meals, to drink plenty of water each day and to get at least 8 hours of sleep each night as these things are known to reduce how often headaches occur.   Please sign up for MyChart if you have not done so. Please plan to return for follow up in 6 months or sooner if needed.  Feel free to contact our office during normal business hours at [phone] with questions or concerns. If there is no answer or the call is outside business hours, please leave a message and our clinic staff will call you back within the next business day.  If you have an urgent concern, please stay on the line for our after-hours answering service and ask for the on-call neurologist.     I also encourage you to use MyChart to communicate with me more directly. If you have not yet signed up for MyChart within [facility], the front desk staff can help you. However, please note that this inbox is NOT monitored on nights or weekends, and response can take up to 2 business days.  Urgent matters should be discussed with the on-call pediatric neurologist.   At [facility], we are committed to providing exceptional care. You will receive a patient satisfaction survey through text or email regarding your visit today. Your opinion is important to me. Comments are appreciated.

## 2024-04-17 DIAGNOSIS — Z5181 Encounter for therapeutic drug level monitoring: Secondary | ICD-10-CM | POA: Diagnosis not present

## 2024-04-17 DIAGNOSIS — F411 Generalized anxiety disorder: Secondary | ICD-10-CM | POA: Diagnosis not present

## 2024-04-17 DIAGNOSIS — F330 Major depressive disorder, recurrent, mild: Secondary | ICD-10-CM | POA: Diagnosis not present

## 2024-04-17 DIAGNOSIS — F909 Attention-deficit hyperactivity disorder, unspecified type: Secondary | ICD-10-CM | POA: Diagnosis not present

## 2024-04-17 DIAGNOSIS — Z79899 Other long term (current) drug therapy: Secondary | ICD-10-CM | POA: Diagnosis not present

## 2024-05-05 DIAGNOSIS — L700 Acne vulgaris: Secondary | ICD-10-CM | POA: Diagnosis not present

## 2024-05-29 ENCOUNTER — Other Ambulatory Visit (INDEPENDENT_AMBULATORY_CARE_PROVIDER_SITE_OTHER): Payer: Self-pay | Admitting: Family

## 2024-05-29 DIAGNOSIS — G43009 Migraine without aura, not intractable, without status migrainosus: Secondary | ICD-10-CM

## 2024-07-06 DIAGNOSIS — J029 Acute pharyngitis, unspecified: Secondary | ICD-10-CM | POA: Diagnosis not present

## 2024-07-09 ENCOUNTER — Other Ambulatory Visit (INDEPENDENT_AMBULATORY_CARE_PROVIDER_SITE_OTHER): Payer: Self-pay | Admitting: Family

## 2024-07-09 DIAGNOSIS — G43009 Migraine without aura, not intractable, without status migrainosus: Secondary | ICD-10-CM

## 2024-07-10 ENCOUNTER — Telehealth (INDEPENDENT_AMBULATORY_CARE_PROVIDER_SITE_OTHER): Payer: Self-pay | Admitting: Pharmacy Technician

## 2024-07-10 ENCOUNTER — Other Ambulatory Visit (HOSPITAL_COMMUNITY): Payer: Self-pay

## 2024-07-10 NOTE — Telephone Encounter (Signed)
 Pharmacy Patient Advocate Encounter   Received notification from CoverMyMeds that prior authorization for Nurtec 75MG  dispersible tablets  is due for renewal.   Insurance verification completed.   The patient is insured through [facility].  Action: Medication is now available without a prior authorization.  Archived Key: [id number]

## 2024-07-28 ENCOUNTER — Encounter (INDEPENDENT_AMBULATORY_CARE_PROVIDER_SITE_OTHER): Payer: Self-pay | Admitting: Pharmacy Technician

## 2024-07-28 ENCOUNTER — Other Ambulatory Visit (HOSPITAL_COMMUNITY): Payer: Self-pay

## 2024-07-28 NOTE — Telephone Encounter (Signed)
 error

## 2024-08-12 ENCOUNTER — Encounter (INDEPENDENT_AMBULATORY_CARE_PROVIDER_SITE_OTHER): Payer: Self-pay

## 2024-08-13 ENCOUNTER — Telehealth (INDEPENDENT_AMBULATORY_CARE_PROVIDER_SITE_OTHER): Payer: Self-pay | Admitting: Pharmacy Technician

## 2024-08-13 ENCOUNTER — Other Ambulatory Visit (HOSPITAL_COMMUNITY): Payer: Self-pay

## 2024-08-13 NOTE — Telephone Encounter (Signed)
 PA has been faxed as Urgent. Fax #: [phone]

## 2024-08-13 NOTE — Telephone Encounter (Signed)
 Pharmacy Patient Advocate Encounter   Received notification from [place] that prior authorization for Nurtec 75MG  dispersible tablets is required/requested.   Insurance verification completed.   The patient is insured through [facility].   Per test claim: PA required; However, when I submit it using Latent through Covermymeds it's still saying No PA needed.   **I called [employer] and the agent stated that it does need a PA b/c the previous one expired on 9/11. He told me to submit it using Blue-E or fax the paper copy.**  Working on the fax now.

## 2024-08-14 ENCOUNTER — Other Ambulatory Visit (HOSPITAL_COMMUNITY): Payer: Self-pay

## 2024-08-14 NOTE — Telephone Encounter (Signed)
 Pharmacy Patient Advocate Encounter  Received notification from [facility] that Prior Authorization for  Nurtec 75MG  dispersible  has been APPROVED from 08/13/24 to 08/13/25. Ran test claim, Copay is $0.00. This test claim was processed through [facility]- copay amounts may vary at other pharmacies due to pharmacy/plan contracts, or as the patient moves through the different stages of their insurance plan.   PA #/Case ID/Reference #: [id number]

## 2024-08-26 DIAGNOSIS — H1045 Other chronic allergic conjunctivitis: Secondary | ICD-10-CM | POA: Diagnosis not present

## 2024-08-26 DIAGNOSIS — J3081 Allergic rhinitis due to animal (cat) (dog) hair and dander: Secondary | ICD-10-CM | POA: Diagnosis not present

## 2024-08-26 DIAGNOSIS — J3089 Other allergic rhinitis: Secondary | ICD-10-CM | POA: Diagnosis not present

## 2024-08-26 DIAGNOSIS — J4530 Mild persistent asthma, uncomplicated: Secondary | ICD-10-CM | POA: Diagnosis not present

## 2024-09-17 ENCOUNTER — Other Ambulatory Visit (INDEPENDENT_AMBULATORY_CARE_PROVIDER_SITE_OTHER): Payer: Self-pay | Admitting: Family

## 2024-09-17 DIAGNOSIS — G43009 Migraine without aura, not intractable, without status migrainosus: Secondary | ICD-10-CM

## 2024-09-29 ENCOUNTER — Encounter (INDEPENDENT_AMBULATORY_CARE_PROVIDER_SITE_OTHER): Payer: Self-pay

## 2024-09-29 DIAGNOSIS — G43901 Migraine, unspecified, not intractable, with status migrainosus: Secondary | ICD-10-CM

## 2024-09-30 MED ORDER — METHYLPREDNISOLONE 4 MG PO TBPK: ORAL_TABLET | ORAL | 0 refills | Status: DC

## 2024-09-30 MED ORDER — KETOROLAC TROMETHAMINE 10 MG PO TABS: ORAL_TABLET | ORAL | 0 refills | Status: DC

## 2024-10-13 DIAGNOSIS — F330 Major depressive disorder, recurrent, mild: Secondary | ICD-10-CM | POA: Diagnosis not present

## 2024-10-13 DIAGNOSIS — F411 Generalized anxiety disorder: Secondary | ICD-10-CM | POA: Diagnosis not present

## 2024-10-25 NOTE — Progress Notes (Signed)
 "   Latoya Macdonald   MRN:  [id number]  [birth year]   Provider: Ellouise Bollman NP-C Location of Care: [facility]  Visit type: Return visit  Last visit: 04/16/2024  Referral source: Dasie Reena Aquas, NP PCP: Dasie Reena Aquas, NP History from: Epic chart and patient  Brief history:  Copied from previous record: History of daily persistent headaches, migraine and tension headaches. She is taking and tolerating Emgality  for migraine prevention. She tapered off Trokendi  XR and Gabapentin  for migraine prevention when the Emgality  gave her improvement in migraine frequency. Gabapentin  was restarted in March 2024 when migraines became problematic again.   Since last visit: Intermittent migraines with occasional prolonged migraine episodes. 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 No health concerns today other than previously mentioned.  Review of systems: Please see HPI for neurologic and other pertinent review of systems. Otherwise all other systems were reviewed and were negative.  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   Medications have included cyproheptadine, nortriptyline, amitriptyline, Depakote, Cymbalta, gabapentin , propranolol, atenolol, Migrelief, Topamax , and Zonegran .   Abortive medications have included rizatriptan, sumatriptan, eletriptan, baclofen, multiple over-the-counter nonsteroidals, tizanidine chlorzoxazone, diclofenac.   Nonpharmacologic treatments have included physical therapy, chiropractic manipulation, ultrasound, TENS unit, acupuncture, dry needling.   Diagnoses have included chronic migraine, chronic tension type headache, occipital neuralgia, TMJ dysfunction.   MRI without contrast in October, 2013 which was normal.   On January 10, 2017 an occipital nerve block was performed with bupivacaine and lidocaine.  The patient had immediate numbness.  This did not provide long-term relief.    She suffered a concussion while swimming July 16, 2018 which exacerbated her headaches. She had a CT scan at [facility] which was normal.    Birth History Infant born at [redacted] weeks gestational age to a 21 year old g 1 p 0 female. Gestation was complicated by uterine fibroids, stress and grief over the sudden unexpected death of her husband Mother received Epidural anesthesia Primary cesarean section for failure to progress after 20 hours of labor and 30 minutes of pushing Nursery Course was uncomplicated Growth and Development was recalled as  normal   Behavior History Anxiety and depression  Surgical history: Past Surgical History:  Procedure Laterality Date   WISDOM TOOTH EXTRACTION  10/11/2023   All were extracted.     Family history: family history includes Migraines in her maternal grandmother.   Social history: Social History   Socioeconomic History   Marital status: Single    Spouse name: Not  on file   Number of children: Not on file    Years of education: Not on file   Highest education level: Not on file  Occupational History   Not on file  Tobacco Use   Smoking status: Never   Smokeless tobacco: Never  Substance and Sexual Activity   Alcohol use: Not on file   Drug use: Not on file   Sexual activity: Not on file  Other Topics Concern   Not on file  Social History Narrative   She is at [employer] for her junior year.   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    [employer] starting her senior year   Lives with mom dad brother 1 dog   Social Drivers of Health   Tobacco Use: Low Risk (04/16/2024)   Patient History    Smoking Tobacco Use: Never    Smokeless Tobacco Use: Never    Passive Exposure: Not on file  Recent Concern: Tobacco Use - High Risk (03/25/2024)   Received from [facility]   Patient History    Smoking Tobacco Use: Former    Smokeless Tobacco Use: Current    Passive Exposure: Current  Physicist, Medical Strain: Not on file  Food Insecurity: Not on file  Transportation Needs: Not on file  Physical Activity: Not on file  Stress: Not on file  Social Connections: Unknown (03/12/2022)   Received from [facility]   Social Network    Social Network: Not on file  Intimate Partner Violence: Unknown (02/01/2022)   Received from [facility]   HITS    Physically Hurt: Not on file    Insult or Talk Down To: Not on file    Threaten Physical Harm: Not on file    Scream or Curse: Not on file  Depression (EYV7-0): Not on file  Alcohol Screen: Not on file  Housing: Not on file  Utilities: Not on file  Health Literacy: Not on file    Past/failed meds: Copied from previous record: Medications have included cyproheptadine, nortriptyline, amitriptyline, Depakote, Cymbalta, gabapentin , propranolol, atenolol, Migrelief, Topamax , and Zonegran .   Abortive medications have included rizatriptan, sumatriptan, eletriptan, baclofen, multiple  over-the-counter nonsteroidals, tizanidine chlorzoxazone, diclofenac and Ubrelvy    Nonpharmacologic treatments have included physical therapy, chiropractic manipulation, ultrasound, TENS unit, acupuncture, dry needling.  Allergies: Allergies[1]   Immunizations:  There is no immunization history on file for this patient.   Diagnostics/Screenings: Copied from previous record: 11/25/2020 - MRI Cervical Spine W/WO contrast - Mildly motion degraded, but otherwise normal MRI of the cervical spine.  11/25/2020 - MRI Brain W/WO contrast - Normal brain MRI   Physical Exam: BP 118/70   Pulse 80   Ht 5' 2.56 (1.589 m)   Wt 127 lb 9.6 oz (57.9 kg)   LMP 10/18/2024 (Exact Date)   BMI 22.92 kg/m   General: Well developed, well nourished young woman, seated in exam room, in no evident distress Head: Head normocephalic and atraumatic.  Oropharynx benign. Neck: Supple Cardiovascular: Regular rate and rhythm, no murmurs Respiratory: Breath sounds clear to auscultation Musculoskeletal: No obvious deformities or scoliosis Skin: No rashes or neurocutaneous lesions  Neurologic Exam Mental Status: Awake and fully alert.  Oriented to place and time.  Recent and remote memory intact.  Attention span, concentration, and fund of knowledge appropriate.  Mood and affect appropriate. Cranial Nerves: Fundoscopic exam reveals sharp disc margins.  Pupils equal, briskly reactive to light.  Extraocular movements full  without nystagmus. Hearing intact and symmetric to whisper.  Facial sensation intact.  Face tongue, palate move normally and symmetrically. Shoulder shrug normal Motor: Normal bulk and tone. Normal strength in all tested extremity muscles. Sensory: Intact to touch and temperature in all extremities.  Coordination: Rapid alternating movements normal in all extremities.  Finger-to-nose and heel-to shin performed accurately bilaterally.  Romberg negative. Gait and Station: Arises from chair without  difficulty.  Stance is normal. Gait demonstrates normal stride length and balance.   Able to heel, toe and tandem walk without difficulty.  Impression: Migraine without aura, not intractable, without status migrainosus - Plan: EMGALITY  120 MG/ML SOAJ, gabapentin  (NEURONTIN ) 300 MG capsule, NURTEC 75 MG TBDP   Recommendations for plan of care: The patient's previous Epic records were reviewed. No recent diagnostic studies to be reviewed with the patient.   Recommendations and plan until next visit: Continue medications as prescribed  Call for questions or concerns Return in about 6 months (around 04/27/2025). Will plan to transition to adult Neurology provider at her next visit.  The medication list was reviewed and reconciled. No changes were made in the prescribed medications today. A complete medication list was provided to the patient.  Allergies as of 10/27/2024       Reactions   Gluten Meal    Intolerance not celiac        Medication List        Accurate as of October 27, 2024 10:30 AM. If you have any questions, ask your nurse or doctor.          STOP taking these medications    dextroamphetamine 10 MG 24 hr capsule Commonly known as: DEXEDRINE SPANSULE Stopped by: Ellouise Bollman, NP   ketorolac  10 MG tablet Commonly known as: TORADOL  Stopped by: Ellouise Bollman, NP   loratadine 10 MG tablet Commonly known as: CLARITIN Stopped by: Ellouise Bollman, NP   loratadine-pseudoephedrine 10-240 MG 24 hr tablet Commonly known as: CLARITIN-D 24-hour Stopped by: Ellouise Bollman, NP   methylPREDNISolone  4 MG Tbpk tablet Commonly known as: MEDROL  DOSEPAK Stopped by: Ellouise Bollman, NP   promethazine  12.5 MG tablet Commonly known as: PHENERGAN  Stopped by: Ellouise Bollman, NP       TAKE these medications    Advair HFA 45-21 MCG/ACT inhaler Generic drug: fluticasone-salmeterol 2 puffs Inhalation Twice a day; Duration: 30   albuterol 108 (90 Base)  MCG/ACT inhaler Commonly known as: VENTOLIN HFA 2 puffs Inhalation q 4-6 hours prn cough/wheeze; Duration: 90 days   alclomethasone 0.05 % cream Commonly known as: ACLOVATE Apply topically.   amphetamine-dextroamphetamine 10 MG tablet Commonly known as: ADDERALL Take 10 mg by mouth 2 (two) times daily.   Azelaic Acid 15 % gel   Azelastine HCl 137 MCG/SPRAY Soln 1 spray ea nostril Nasally Twice a day; Duration: 90 days   busPIRone 5 MG tablet Commonly known as: BUSPAR Take 5 mg by mouth 2 (two) times daily.   cephALEXin 500 MG capsule Commonly known as: KEFLEX Take 500 mg by mouth 2 (two) times daily.   cetirizine 10 MG tablet Commonly known as: ZYRTEC Take by mouth.   clindamycin 1 % external solution Commonly known as: CLEOCIN T Apply topically.   desogestrel-ethinyl estradiol 0.15-0.02/0.01 MG (21/5) tablet Commonly known as: MIRCETTE Take 1 tablet by mouth daily.   desvenlafaxine 100 MG 24 hr tablet Commonly known as: PRISTIQ Take 100 mg by mouth daily.   Emgality  120 MG/ML Soaj Generic drug: Galcanezumab -gnlm Inject 120 mg under the skin  every 30 days. This is in addition to Nurtec which is for rescue treatment of migraine. What changed: See the new instructions. Changed by: Ellouise Bollman, NP   gabapentin  300 MG capsule Commonly known as: NEURONTIN  Take 1 capsule (300 mg total) by mouth 2 (two) times daily.   Nurtec 75 MG Tbdp Generic drug: Rimegepant Sulfate Take 1 tablet at onset of migraine. May repeat once in 24 hours if migraine persists   traZODone 50 MG tablet Commonly known as: DESYREL      I spent 20 minutes caring for the patient today face to face reviewing records, including previous charts and test results, examination of the patient, discussion and education with the parent about her condition, documentation in her chart, developing a plan of care and ordering refills.  Ellouise Bollman NP-C [facility] [address] [place], [place] [address] Ph. [phone] Fax [phone]           [1]  Allergies Allergen Reactions   Gluten Meal     Intolerance not celiac   "

## 2024-10-27 ENCOUNTER — Ambulatory Visit (INDEPENDENT_AMBULATORY_CARE_PROVIDER_SITE_OTHER): Admitting: Family

## 2024-10-27 ENCOUNTER — Encounter (INDEPENDENT_AMBULATORY_CARE_PROVIDER_SITE_OTHER): Payer: Self-pay | Admitting: Family

## 2024-10-27 VITALS — BP 118/70 | HR 80 | Ht 62.56 in | Wt 127.6 lb

## 2024-10-27 DIAGNOSIS — G43009 Migraine without aura, not intractable, without status migrainosus: Secondary | ICD-10-CM

## 2024-10-27 MED ORDER — GABAPENTIN 300 MG PO CAPS: 300.0000 mg | ORAL_CAPSULE | Freq: Two times a day (BID) | ORAL | 1 refills | Status: AC

## 2024-10-27 MED ORDER — EMGALITY 120 MG/ML ~~LOC~~ SOAJ: SUBCUTANEOUS | 5 refills | Status: AC

## 2024-10-27 MED ORDER — NURTEC 75 MG PO TBDP: ORAL_TABLET | ORAL | 5 refills | Status: AC

## 2024-10-27 NOTE — Patient Instructions (Signed)
 It was a pleasure to see you today!  Instructions for you until your next appointment are as follows: Continue taking your medications as prescribed Remember that it is important for you to avoid skipping meals, to drink plenty of water each day and to get at least 8 hours of sleep each night as these things are known to reduce how often headaches occur.   Call for questions or concerns Please sign up for MyChart if you have not done so. Please plan to return for follow up in 6 months or sooner if needed.  Feel free to contact our office during normal business hours at [phone] with questions or concerns. If there is no answer or the call is outside business hours, please leave a message and our clinic staff will call you back within the next business day.  If you have an urgent concern, please stay on the line for our after-hours answering service and ask for the on-call neurologist.     I also encourage you to use MyChart to communicate with me more directly. If you have not yet signed up for MyChart within [facility], the front desk staff can help you. However, please note that this inbox is NOT monitored on nights or weekends, and response can take up to 2 business days.  Urgent matters should be discussed with the on-call pediatric neurologist.   At [facility], we are committed to providing exceptional care. You will receive a patient satisfaction survey through text or email regarding your visit today. Your opinion is important to me. Comments are appreciated.

## 2025-04-27 ENCOUNTER — Ambulatory Visit (INDEPENDENT_AMBULATORY_CARE_PROVIDER_SITE_OTHER): Payer: Self-pay | Admitting: Family
